# Patient Record
Sex: Female | Born: 1945 | Race: White | Hispanic: No | Smoking: Never smoker
Health system: Southern US, Community
[De-identification: ages and names within clinical notes are randomized; demographics above are authoritative.]

## PROBLEM LIST (undated history)

## (undated) DIAGNOSIS — I4819 Other persistent atrial fibrillation: Secondary | ICD-10-CM

## (undated) DIAGNOSIS — S8991XA Unspecified injury of right lower leg, initial encounter: Secondary | ICD-10-CM

## (undated) DIAGNOSIS — F419 Anxiety disorder, unspecified: Secondary | ICD-10-CM

## (undated) DIAGNOSIS — F329 Major depressive disorder, single episode, unspecified: Secondary | ICD-10-CM

## (undated) DIAGNOSIS — I890 Lymphedema, not elsewhere classified: Secondary | ICD-10-CM

## (undated) DIAGNOSIS — M1990 Unspecified osteoarthritis, unspecified site: Secondary | ICD-10-CM

## (undated) DIAGNOSIS — R5383 Other fatigue: Secondary | ICD-10-CM

## (undated) DIAGNOSIS — I10 Essential (primary) hypertension: Secondary | ICD-10-CM

## (undated) DIAGNOSIS — F32A Depression, unspecified: Secondary | ICD-10-CM

## (undated) HISTORY — DX: Unspecified osteoarthritis, unspecified site: M19.90

## (undated) HISTORY — DX: Essential (primary) hypertension: I10

## (undated) HISTORY — DX: Other persistent atrial fibrillation: I48.19

## (undated) HISTORY — DX: Other fatigue: R53.83

## (undated) HISTORY — DX: Lymphedema, not elsewhere classified: I89.0

## (undated) HISTORY — DX: Major depressive disorder, single episode, unspecified: F32.9

## (undated) HISTORY — DX: Unspecified injury of right lower leg, initial encounter: S89.91XA

## (undated) HISTORY — DX: Depression, unspecified: F32.A

## (undated) HISTORY — DX: Anxiety disorder, unspecified: F41.9

---

## 2006-01-26 ENCOUNTER — Ambulatory Visit: Payer: Self-pay | Admitting: Oncology

## 2009-08-28 ENCOUNTER — Encounter: Payer: Self-pay | Admitting: Cardiovascular Disease

## 2009-09-25 ENCOUNTER — Ambulatory Visit: Payer: Self-pay | Admitting: Cardiovascular Disease

## 2009-10-04 ENCOUNTER — Ambulatory Visit: Payer: Self-pay

## 2009-10-04 ENCOUNTER — Encounter: Payer: Self-pay | Admitting: Cardiovascular Disease

## 2009-10-04 ENCOUNTER — Ambulatory Visit (HOSPITAL_COMMUNITY): Admission: RE | Admit: 2009-10-04 | Discharge: 2009-10-04 | Payer: Self-pay | Admitting: Cardiovascular Disease

## 2009-10-04 ENCOUNTER — Ambulatory Visit: Payer: Self-pay | Admitting: Cardiovascular Disease

## 2009-11-06 ENCOUNTER — Ambulatory Visit: Payer: Self-pay | Admitting: Cardiovascular Disease

## 2010-01-29 ENCOUNTER — Ambulatory Visit: Payer: Self-pay | Admitting: Cardiovascular Disease

## 2010-02-05 ENCOUNTER — Telehealth: Payer: Self-pay | Admitting: Cardiovascular Disease

## 2010-03-05 ENCOUNTER — Ambulatory Visit: Payer: Self-pay | Admitting: Cardiovascular Disease

## 2011-01-23 NOTE — Progress Notes (Signed)
  Phone Note Outgoing Call   Call placed by: Dessie Coma LPN Call placed to: Patient Summary of Call: Patient notified per Dr. Freida Busman, Echo showed normal heart function.

## 2011-02-18 ENCOUNTER — Telehealth: Payer: Self-pay | Admitting: Cardiovascular Disease

## 2011-02-24 ENCOUNTER — Ambulatory Visit (INDEPENDENT_AMBULATORY_CARE_PROVIDER_SITE_OTHER): Payer: Medicare Other | Admitting: Cardiovascular Disease

## 2011-02-24 DIAGNOSIS — I10 Essential (primary) hypertension: Secondary | ICD-10-CM

## 2011-02-24 DIAGNOSIS — I4891 Unspecified atrial fibrillation: Secondary | ICD-10-CM

## 2011-02-27 NOTE — Progress Notes (Signed)
Summary: pt request call  Phone Note Call from Patient Call back at Home Phone [phone]   Caller: Patient Reason for Call: Talk to Nurse Summary of Call: Pt request call Initial call taken by: Judie Grieve,  February 18, 2011 10:19 AM  Follow-up for Phone Call        Phone Call Completed, Appt Scheduled for Monday 02/24/11.  Having no problems-just wants check up. Follow-up by: Dessie Coma  LPN,  February 18, 2011 10:30 AM

## 2011-03-03 ENCOUNTER — Encounter: Payer: Self-pay | Admitting: Cardiovascular Disease

## 2011-03-14 NOTE — Assessment & Plan Note (Signed)
[facility] CARDIOLOGY OFFICE NOTE  CHIRSTY, Holmes                    MRN:          [id number] DATE:02/24/2011                            DOB:          11-22-1946   Erika Holmes is a 65 year old female who is here today for a followup visit.  She has following problem list: 1. Paroxysmal atrial fibrillation. 2. Hypertension. 3. Lymphedema. 4. Traumatic right leg injury, which resulted in severe infection     leading to multiple surgeries.  INTERIM HISTORY:  The patient had a fall last year in March.  She had an infected right leg, likely related to her history of lymphedema.  She was hospitalized for 2-1/2 months at [facility] due to needing multiple surgeries and wound care.  According to the patient, she only had atrial fibrillation once there.  Overall, she has been relatively stable from a cardiac standpoint.  She has been feeling tired lately and has been bradycardic.  Her blood pressure has not been running high at home according to the patient.  She will be having hard time affording Rhythmol sustained release and is asking for an alternative.  She denies any chest pain or dyspnea.  MEDICATIONS: 1. Lisinopril 40 mg once daily. 2. Metoprolol succinate 50 mg twice daily, which will be decreased to     once daily. 3. Ambien 10 mg at bedtime as needed. 4. Rhythmol sustained release 325 mg twice daily. 5. Aspirin 325 mg once daily.  ALLERGIES:  CODEINE.  PHYSICAL EXAMINATION:  GENERAL:  She appears to be older than her stated age, but in no acute distress. VITAL SIGNS:  Weight is 217 pounds, blood pressure is 160/80, pulse is 50, oxygen saturation is 99% on room air. HEENT:  Normocephalic, atraumatic. NECK:  No JVD or carotid bruits. RESPIRATORY:  Normal respiratory effort with no use of accessory muscles.  Auscultation reveals normal breath sounds.  CARDIOVASCULAR: Normal PMI.   Normal S1 and S2 with no gallops.  There is a 1/6 systolic ejection murmur at the base.  ABDOMEN:  Benign, nontender, nondistended. EXTREMITIES:  With bilateral lower extremity lymphedema with extensive scarring in the right leg from surgery.  She also has a special wrapping on both legs. PSYCHIATRIC:  She is alert, oriented x3 with normal mood and affect.  Electrocardiogram was performed, which showed sinus bradycardia with a heart rate of 48 beats per minute, left axis deviation and poor R-wave progression in the anterior leads, which is not different from her previous electrocardiogram.  IMPRESSION: 1. Paroxysmal atrial fibrillation:  She continues to be in normal     sinus rhythm with Rhythmol SR, which will be continued twice daily.     She will be having hard time affording this medication by September     and asked about alternatives.  We will check the pricing on the     propafenone immediate release to see if she will be able to afford     that.  Alternatively, we can consider a different antiarrhythmic     medication such as sotalol.  If we are going to switch her  antiarrhythmic medications, we will request an echocardiogram     during her next followup visit.  I again had a prolonged discussion     with her about indications for anticoagulation.  She had hard time     with warfarin in the past and her INR could not be controlled.  The     patient does not want to go back on warfarin at any cost.  I     discussed with her other alternatives such as Pradaxa and other     agents.  She is not able to afford these medications.  Her current     CHADS VAS score is 3.  I think down the road and the next 1 hour 2     years, it becomes more important that she goes on anticoagulation     when her risk of stroke goes up. 2. Hypertension:  Blood pressure is not well controlled.  According to     the patient, she is anxious.  We will consider adding amlodipine     upon followup.  The  patient will follow up in 4 months from now or     earlier if needed.    [name], MD Electronically Signed   MA/MedQ  DD: 02/24/2011  DT: 02/25/2011  Job #: [id number]

## 2011-05-06 NOTE — Assessment & Plan Note (Signed)
[facility] CARDIOLOGY OFFICE NOTE   JAYELYN, BARNO                    MRN:          [id number]  DATE:09/25/2009                            DOB:          03-Dec-1946    CHIEF COMPLAINT:  Establishing cardiovascular care.   HISTORY OF PRESENT ILLNESS:  The patient is a 65 year old white female  with past medical history significant for remote atrial fibrillation,  hypertension, hyperlipidemia who is presenting to reestablish  cardiovascular care.  The patient was previously been seen by [facility].  She states her main cardiovascular problem has been atrial  fibrillation.  However, the problem started several years ago when she  had 1 episode of symptomatic atrial fibrillation.  She states she was  admitted to the hospital for several days.  Several years later, the  patient had an additional episode of atrial fibrillation that was again  symptomatic.  The patient had initially been placed on Coumadin.  However, she had some issues with expanding hematoma in her lower  extremity that lasts quite sometime and the Coumadin was stopped for  this reason.  She was subsequently placed on Rythmol for which she has  been on for some time for rhythm control.  The patient denies any recent  chest discomfort, palpitations or dizziness.  She does state she has had  an increase in her lower extremity swelling.  Although, she states she  has always had larger legs.  She believes that there is now some  additional fluid.  The patient states she also has a family history of  lymphedema and is concerned this may be part of the problem.   PAST MEDICAL HISTORY:  As above in HPI.   FAMILY HISTORY:  Positive for coronary artery disease.   SOCIAL HISTORY:  Negative for tobacco or alcohol use.   ALLERGIES:  CODEINE.   MEDICATIONS:  1. Aspirin 81 mg daily.  2. Metoprolol succinate ER 50 mg b.i.d.  3. Lisinopril 40 mg  daily.  4. Zolpidem 10 mg nightly.  5. Rythmol SR 225 mg every 12 hours.   REVIEW OF SYSTEMS:  As above in HPI.  In addition, the patient has a  history of some depression, osteoarthritis, fatigue, asthma-like  symptoms and anxiety.   LABORATORY DATA:  Review of the patient's record shows labs from August  2010 with a sodium of 140, potassium of 4.3, chloride 109, CO2 23, BUN  30, creatinine 1.0.  CBC showed a white count of 8, hemoglobin of 13.5,  hematocrit 40.3, platelet count 210.  Her LDL was 137, her HDL of 59,  total cholesterol was 211, and triglycerides was 74.  Review of the  patient's echocardiogram report dated July 2006.  At that time, there is  mild concentric left ventricular hypertrophy, the ejection fraction was  approximately 60-65%.  At that time, the patient was in atrial  fibrillation.  There is also mild MR and mild TR.  EKG from today  independently interpreted by myself demonstrates sinus bradycardia with  some minimal baseline artifact.  The ventricular rate is 59 beats per  minute.  There is a nonspecific T-wave abnormalities.   ASSESSMENT:  A 65 year old white female with atrial fibrillation that  appears to be rhythm controlled with Rythmol.  She also has increasing  lower extremity edema.  Lower extremity edema may be secondary to a  lymphedema with a component of diastolic dysfunction as well.  Her  increased BUN creatinine ratio suggests that her cardiac output may be  decreased.   PLAN:  Today, in clinic, we would like to order an echocardiogram to  evaluate her left ventricular function for signs of any diastolic  dysfunction.  We would also like to place the patient on a 48-hour  Holter monitor test to evaluate for atrial fibrillation or any other  arrhythmia that may be occurring asymptomatically.  I will start her on  a Lasix 20 mg daily.  It may be worthwhile in the future for the patient  to see a wound care center or podiatrist for potential  treatments of  lymphedema.  We will check a BNP in 1 weeks' time after she has been on  Lasix.  We will see her back in clinic in 2 weeks.     Brayton El, MD  Electronically Signed    SGA/MedQ  DD: 09/26/2009  DT: 09/27/2009  Job #: [id number]

## 2011-05-06 NOTE — Assessment & Plan Note (Signed)
[facility] CARDIOLOGY OFFICE NOTE   Erika Holmes, Erika Holmes                    MRN:          [id number]  DATE:01/29/2010                            DOB:          12/11/1946    PROBLEM LIST:  1. Remote history of atrial fibrillation.  She recently wore 48-hour      Holter monitor which showed sinus rhythm with premature atrial      contraction.  She had heart catheterization in 2002 which showed no      coronary artery disease.  2. Hypertension.  3. Lymphedema.   INTERVAL HISTORY:  Since the patient's last office visit, she was  admitted to the hospital with AFib with RPR.  She was rate controlled  and sent home.  The patient states that the symptoms she experienced  prior to that hospital admission was very similar to what she  experienced 7 years ago when she had atrial fibrillation.  Since she has  been home, she has not had any recurrence of these symptoms, although  she continues to get occasional palpitations that have been demonstrated  to be PACs on a prior Holter monitor.  She is compliant with her  medications and has had her blood pressure checked at a primary care  doctor's office and she believes that it was in the 130 systolic.   PHYSICAL EXAMINATION:  VITAL SIGNS:  Today, her blood pressure is 181/82  in the right arm and 183/93 in the left arm, pulse is 70, she weighs 269  pounds.  She is sating 97% on room air.  GENERAL:  No acute distress.  HEENT:  Normocephalic, atraumatic.  NECK:  Supple.  HEART:  Regular rate and rhythm without murmur, rub or gallop.  LUNGS:  Clear bilaterally.  ABDOMEN:  Soft, nontender, nondistended.  EXTREMITIES:  Bilateral lower extremity lymphedema with some ecchymoses  and bruising over her lower extremities.  SKIN:  Some bruising on her hands from IV use while in the hospital.   ASSESSMENT AND PLAN:  1. Atrial fibrillation.  The patient has been on Rythmol for  approximately 7 years without any demonstrated episodes of atrial      fibrillation until recently.  Today, we will increase her Rythmol      SR from 225 mg twice a day to 325 mg twice a day.  She should      continue on the Toprol-XL 50 mg b.i.d.  The patient has      hypertension and diastolic dysfunction giving her CHADS 2 score of      at least 1 and possibly 2 counting the diastolic dysfunction and a      CHADS-VASC score of 2-3.  In the past, she has had issues with      Coumadin in that her INR was very difficult to regulate per her.      The patient will contact her insurance company to see how much      Pradaxa therapy would cost and will contact our office.  It was      likely that we will institute this therapy if she  can afford it.  2. Hypertension.  The patient is on hydrochlorothiazide 25 mg daily,      lisinopril 40 mg daily, and Toprol-XL 50 mg twice a day.  Today,      her blood pressure is elevated.  However, she states that it is      usually with normal limits.  My review of the hospital record shows      that her systolic blood pressures ranged from 120-160.  We will ask      that she check her blood pressure twice a day at home and then      phone this log in to [place].  3. Diastolic dysfunction.  The patient had a recent echo at [place]      that we will have sent to our office to assure that there has been      no change, but once we have an idea of what her blood pressure      readings are at home, I will make a decision regarding advancing      antihypertensive therapy and a possible evaluation for secondary      causes of hypertension as patient does have some shortness of      breath that may be at least in part secondary to diastolic      dysfunction.  Better control of her blood pressure may help address      this.      Brayton El, MD  Electronically Signed    SGA/MedQ  DD: 01/29/2010  DT: 01/30/2010  Job #: [id number]

## 2011-05-06 NOTE — Assessment & Plan Note (Signed)
[facility]                        [place] CARDIOLOGY OFFICE NOTE   Erika Holmes, Erika Holmes                    MRN:          [id number]  DATE:11/06/2009                            DOB:          1946-04-27    PROBLEM LIST:  1. Remote history of atrial fibrillation.  The patient has recently      wore a 48-hour Holter monitor that showed no recurrence of atrial      fibrillation on Rythmol.  She also had a heart catheterization that      showed no coronary artery disease in 2002.  2. Hypertension.  3. Lymphedema.   INTERVAL HISTORY:  The patient states since her last visit she has had  some increasing pain in her right knee.  She plans to see an orthopedic  surgeon regarding this.  She states that she had 1 episode of  palpitations lasting approximately 20 minutes that resolved after taking  an extra half of Toprol.  She has had no other episodes of palpitations.  She also denies any chest pain.  She has a chronic lymphedema and  occasionally has swelling in her feet as well that resolves after  elevation of the foot.  She recently saw a surgeon regarding this and he  has ordered some type of compression treatments for her at home.   PHYSICAL EXAMINATION:  VITAL SIGNS:  Blood pressure 163/85, pulse 78,  weight 266, sating 98% on room air.  GENERAL:  No acute distress.  HEENT:  Nonfocal.  HEART:  Regular rate and rhythm, but distant heart sounds.  LUNGS:  Clear bilaterally.  ABDOMEN:  Soft, nontender.  EXTREMITIES:  Bilateral lower extremity lymphedema with compression hose  in place.   LABORATORY DATA:  Review of the patient's echocardiogram from the last  visit shows an ejection fraction of 55-60% with pseudonormalization of  left ventricle indicating diastolic dysfunction.  Review of the  patient's 48-hour Holter monitor demonstrates, normal sinus rhythm with  premature atrial contractions.   ASSESSMENT AND PLAN:  1. Atrial fibrillation.   The patient is to continue on Rythmol.  It      appears that her rhythm control has been successful with this      medication.  If she were to experience any palpitations during the      day that are continuous, she should report to our office.  If they      occur at night and they do not resolve after taking an extra      Toprol, she should report to the emergency room.  She is on      aspirin.  If she were to prove to have atrial fibrillation in the      future, we would reconsider anticoagulation with either Coumadin or      perhaps dabigatran.  Of note, the patient states she had a lot      problems with Coumadin in the past and that her INR was very      difficult to be regulated.  2. Hypertension.  The patient's blood pressure remains elevated.  She  states that it is always over 140s systolic.  Today, we will add      hydrochlorothiazide 25 mg daily to her medical regimen.  She can      take this instead of the furosemide 20 mg daily, which she is      currently taking.  She states furosemide has not really done      anything to improve her symptoms or edema.  3. Diastolic dysfunction.  The patient does have some dyspnea on      exertion and an echo that is consistent with a diastolic      dysfunction.  We will optimize her blood pressure control in order      to minimize the clinical fact that this diastolic dysfunction may      have on her.  She is followed monthly by Dr. Nedra Hai and we will see      her again in a 4 months' time.     Brayton El, MD  Electronically Signed    SGA/MedQ  DD: 11/06/2009  DT: 11/07/2009  Job #: [id number]

## 2011-05-06 NOTE — Assessment & Plan Note (Signed)
[facility] CARDIOLOGY OFFICE NOTE   Erika Holmes, Erika Holmes                    MRN:          [id number]  DATE:03/05/2010                            DOB:          [birth year]    PROBLEM LIST:  1. History of atrial fibrillation.  She wore a recent 48-hour Holter      monitor that showed sinus rhythm with premature atrial      contractions, but she was admitted to the hospital in February of      this year with atrial fibrillation with rapid ventricular response.  2. Hypertension.  3. Lymphedema.   The patient states she has been doing well since her last visit.  She  continues to get occasional palpitations in the evenings that have been  demonstrated in the past, not to be atrial fibrillation.  She states she  has not had any recurrence of symptomatic atrial fibrillation.  She also  denies any chest discomfort or significant change in dyspnea.  She  states she did fall which caused an injury to the lateral aspect of her  right leg that has been oozing blood since then, but she denies any  warmth or pus drainage.   PHYSICAL EXAMINATION:  VITAL SIGNS:  Today, her blood pressure is  144/85, pulse is 83, saturating 96% on room air.  She weighs 269 pounds  which is what she weighed 1 month ago.  GENERAL:  She is in no acute distress.  HEENT:  Normocephalic, atraumatic.  HEART:  Regular rate and rhythm without murmur, rub, or gallop.  LUNGS:  Clear bilaterally.  EXTREMITIES:  Bilateral lower extremity lymphedema.  There is moderate-  to-severe ecchymoses over her right leg and there is a bandaged area on  the lateral aspect of the leg.   Review of labs, a lipid profile dated, February 6, shows a LDL of 89,  triglyceride level of 53, and a cholesterol HDL ratio of 2.9.   ASSESSMENT AND PLAN:  1. Atrial fibrillation.  She should continue on Rythmol SR 325 mg      b.i.d.  She should also continue on aspirin 325 mg daily.   Once the      issues with the oozing blood on the right leg resolved, I would      like to have another discussion with her about retrying Coumadin.      In the past, she had been on Coumadin, but she states there were      great difficulties in regulating her INR, so it was stopped.  She      currently cannot afford Pradaxa therapy.  Her CHADS-VAS score is      between 2 and 3.  2. Hypertension.  Blood pressure is under reasonable control today.      She should continue on her current medical regimen as listed in the      chart.  3. Lower extremity wound.  She should continue bandage dressings.  She      will follow up with      her primary care physician regarding further management of the  wound.  4. Lipids.  Her last LDL was within normal limits.  We will see the      patient back in 4 months' time unless problem arises in the      interim.     Brayton El, MD  Electronically Signed    SGA/MedQ  DD: 03/05/2010  DT: 03/06/2010  Job #: [id number]

## 2011-05-06 NOTE — Letter (Signed)
September 26, 2009    [name], MD  [address]  [place], [place] [address]   RE:  [name], [name]  MRN:  [id number]  /  DOB:  [birth year]   Dear Dr. Nedra Hai:   I am writing to you regarding your patient, Erika Holmes, whom I saw  in Cardiology Clinic.  As you know, she is a 65 year old white female  with a history of atrial fibrillation, hypertension, hyperlipidemia and  lower extremity edema.  She comes to the Cardiology Clinic complaining  of increasing lower extremity edema and questions about the management  for her atrial fibrillation.  It does not appear that she is having any  symptomatic atrial fibrillation.  However, I have ordered a 48-hour  Holter monitor for further evaluation.  I have also ordered a  transthoracic echocardiogram to assure that her ejection fraction  remains normal and to look for any signs of diastolic dysfunction.  It  is likely that the lower extremity edema has some component of diastolic  dysfunction and perhaps lymphedema.   I look forward to following this patient along with you.  Please feel  free to contact my office in the future with any questions or concerns.    Sincerely,      Brayton El, MD  Electronically Signed    SGA/MedQ  DD: 09/26/2009  DT: 09/26/2009  Job #: [id number]

## 2011-07-07 ENCOUNTER — Ambulatory Visit: Payer: Medicare Other | Admitting: Cardiovascular Disease

## 2011-07-14 ENCOUNTER — Encounter: Payer: Self-pay | Admitting: Cardiovascular Disease

## 2011-07-14 ENCOUNTER — Ambulatory Visit (INDEPENDENT_AMBULATORY_CARE_PROVIDER_SITE_OTHER): Payer: Medicare Other | Admitting: Cardiovascular Disease

## 2011-07-14 VITALS — BP 113/74 | HR 71 | Ht 62.0 in | Wt 203.0 lb

## 2011-07-14 DIAGNOSIS — I4819 Other persistent atrial fibrillation: Secondary | ICD-10-CM

## 2011-07-14 DIAGNOSIS — I10 Essential (primary) hypertension: Secondary | ICD-10-CM | POA: Insufficient documentation

## 2011-07-14 DIAGNOSIS — I4891 Unspecified atrial fibrillation: Secondary | ICD-10-CM

## 2011-07-14 NOTE — Progress Notes (Signed)
HPI  This is a 65 year old female who is here today for a followup visit. She has a history of atrial fibrillation in pain and in sinus rhythm with Rythmol. She has severe lymphedema of lower extremities with chronic wounds. She had an injury to her right leg last year which took months to heal. She had a recent injury to her left leg and seems to be having difficulty with wound healing as well. She seems to be stable from a cardiac standpoint. She denies any chest pain or dyspnea. Her palpitations are rare and brief.  Allergies  Allergen Reactions  . Codeine      Current Outpatient Prescriptions on File Prior to Visit  Medication Sig Dispense Refill  . ALPRAZolam (XANAX) 0.25 MG tablet Take 0.25 mg by mouth 3 (three) times daily as needed.        Marland Kitchen lisinopril (PRINIVIL,ZESTRIL) 40 MG tablet Take 40 mg by mouth daily.        . metoprolol (TOPROL-XL) 50 MG 24 hr tablet Take 50 mg by mouth 2 (two) times daily.        . OXYCODONE-ACETAMINOPHEN PO Take 5-325 mg by mouth every 8 (eight) hours as needed. Take 1/2 tablet every 8 hours prn       . propafenone (RYTHMOL SR) 325 MG 12 hr capsule Take 325 mg by mouth 2 (two) times daily.        Marland Kitchen zolpidem (AMBIEN) 10 MG tablet Take 10 mg by mouth at bedtime as needed.           Past Medical History  Diagnosis Date  . Lymphedema   . Right leg injury   . Asthma   . Fatigue   . Arthritis   . Anxiety and depression   . Hypertension   . Persistent atrial fibrillation      No past surgical history on file.   No family history on file.   History   Social History  . Marital Status: Widowed    Spouse Name: N/A    Number of Children: N/A  . Years of Education: N/A   Occupational History  . Not on file.   Social History Main Topics  . Smoking status: Never Smoker   . Smokeless tobacco: Not on file  . Alcohol Use: No  . Drug Use: No  . Sexually Active: Not on file   Other Topics Concern  . Not on file   Social History Narrative    . No narrative on file       PHYSICAL EXAM   BP 113/74  Pulse 71  Ht 5\' 2"  (1.575 m)  Wt 203 lb (92.08 kg)  BMI 37.13 kg/m2  SpO2 98%  Constitutional: She is oriented to person, place, and time. She appears well-developed and well-nourished. No distress.  HENT: No nasal discharge.  Head: Normocephalic and atraumatic.  Eyes: Pupils are equal, round, and reactive to light. Right eye exhibits no discharge. Left eye exhibits no discharge.  Neck: Normal range of motion. Neck supple. No JVD present. No thyromegaly present.  Cardiovascular: Normal rate, regular rhythm, normal heart sounds and intact distal pulses. Exam reveals no gallop and no friction rub.  No murmur heard.  Pulmonary/Chest: Effort normal and breath sounds normal. No stridor. No respiratory distress. She has no wheezes. She has no rales. She exhibits no tenderness.  Abdominal: Soft. Bowel sounds are normal. She exhibits no distension. There is no tenderness. There is no rebound and no guarding.  Musculoskeletal: Normal range  of motion. She exhibits severe nonpitting edema and no tenderness. The left leg is wrapped. Neurological: She is alert and oriented to person, place, and time. Coordination normal.  Skin: Skin is warm and dry. No rash noted. She is not diaphoretic. No erythema. No pallor. Significant scar tissue in the right leg from previous surgeries.  Psychiatric: She has a normal mood and affect. Her behavior is normal. Judgment and thought content normal.     EKG: Normal sinus rhythm with no significant ST or T wave changes. Normal PR and QT intervals   ASSESSMENT AND PLAN

## 2011-07-14 NOTE — Assessment & Plan Note (Signed)
The patient remains mostly normal sinus rhythm with Rythmol. However, starting in September she will enter the donut hole and will not be able to afford the medication. She checked on generic propafenone and that seems to be the same price. Due to that, I will give her Multaq 400 mg twice daily with food during that time. She was given samples. If the medication works for, a prescription will be sent. She will let us know at that time. I gave her co-pay cord also to see if that can work with her insurance. Continue aspirin daily. Her CHADS score is 1.

## 2011-08-07 ENCOUNTER — Encounter: Payer: Self-pay | Admitting: Cardiovascular Disease

## 2011-10-08 ENCOUNTER — Other Ambulatory Visit: Payer: Self-pay | Admitting: Cardiovascular Disease

## 2011-10-08 NOTE — Telephone Encounter (Signed)
Pt calling wanting to know if there is a cheaper version of this RX--rythmol. Please return pt call to discuss further.  Pt RX says phone number for pharmacy is [phone].

## 2011-10-09 MED ORDER — PROPAFENONE HCL ER 325 MG PO CP12: 325.0000 mg | ORAL_CAPSULE | Freq: Two times a day (BID) | ORAL | Status: DC

## 2012-01-19 ENCOUNTER — Ambulatory Visit: Payer: Medicare Other | Admitting: Internal Medicine

## 2012-02-02 ENCOUNTER — Ambulatory Visit: Payer: Medicare Other | Admitting: Internal Medicine

## 2012-02-20 ENCOUNTER — Ambulatory Visit (INDEPENDENT_AMBULATORY_CARE_PROVIDER_SITE_OTHER): Payer: Medicare Other | Admitting: Internal Medicine

## 2012-02-20 ENCOUNTER — Encounter: Payer: Self-pay | Admitting: Internal Medicine

## 2012-02-20 VITALS — BP 130/82 | HR 65 | Resp 18 | Ht 64.0 in | Wt 245.4 lb

## 2012-02-20 DIAGNOSIS — I4819 Other persistent atrial fibrillation: Secondary | ICD-10-CM

## 2012-02-20 DIAGNOSIS — I10 Essential (primary) hypertension: Secondary | ICD-10-CM

## 2012-02-20 DIAGNOSIS — I4891 Unspecified atrial fibrillation: Secondary | ICD-10-CM

## 2012-02-20 MED ORDER — PROPAFENONE HCL 225 MG PO TABS: 225.0000 mg | ORAL_TABLET | Freq: Three times a day (TID) | ORAL | Status: DC

## 2012-02-20 NOTE — Assessment & Plan Note (Signed)
Stable No change required today  

## 2012-02-20 NOTE — Progress Notes (Signed)
[name], MD, MD: Primary Cardiologist:  Previously Dr Kirke Corin in [facility] Erika Holmes is a 66 y.o. female with a h/o atrial fibrillation who presents today to establish care in the Electrophysiology device clinic.    The patient reports doing very well since taking rhythmol SR.  Her concern is that she cannot afford this medication.  She denies any recent episodes of afib but reports brief palpitations at night.   Today, she  denies symptoms of chest pain, shortness of breath, orthopnea, PND, dizziness, presyncope, syncope, or neurologic sequela.  She has chronic lymphedema which has been a struggle.  She previously took coumadin but stopped this medicine due to labile INRs.  The patientis tolerating medications without difficulties and is otherwise without complaint today.   Past Medical History  Diagnosis Date  . Lymphedema   . Right leg injury   . Asthma   . Fatigue   . Arthritis   . Anxiety and depression   . Hypertension   . Persistent atrial fibrillation    No past surgical history on file.  History   Social History  . Marital Status: Widowed    Spouse Name: N/A    Number of Children: N/A  . Years of Education: N/A   Occupational History  . Not on file.   Social History Main Topics  . Smoking status: Never Smoker   . Smokeless tobacco: Not on file  . Alcohol Use: No  . Drug Use: No  . Sexually Active: Not on file   Other Topics Concern  . Not on file   Social History Narrative   Lives in [place]    No family history on file.  Allergies  Allergen Reactions  . Codeine     Current Outpatient Prescriptions  Medication Sig Dispense Refill  . ALPRAZolam (XANAX) 0.25 MG tablet Take 0.25 mg by mouth 3 (three) times daily as needed.        Marland Kitchen aspirin 81 MG tablet Take 81 mg by mouth daily.        Marland Kitchen lisinopril (PRINIVIL,ZESTRIL) 40 MG tablet Take 40 mg by mouth daily.        . metoprolol (TOPROL-XL) 50 MG 24 hr tablet Take 50 mg by mouth 2 (two) times daily.         Marland Kitchen omeprazole (PRILOSEC) 20 MG capsule Take 20 mg by mouth daily.      . OXYCODONE-ACETAMINOPHEN PO Take 5-325 mg by mouth every 8 (eight) hours as needed. Take 1/2 tablet every 8 hours prn       . promethazine (PHENERGAN) 25 MG tablet Take 25 mg by mouth every 6 (six) hours as needed.        . zolpidem (AMBIEN) 10 MG tablet Take 10 mg by mouth at bedtime as needed.          ROS- all systems are reviewed and negative except as per HPI  Physical Exam: Filed Vitals:   02/20/12 1016  BP: 130/82  Pulse: 65  Resp: 18  Height: 5\' 4"  (1.626 m)  Weight: 245 lb 6.4 oz (111.313 kg)    GEN- The patient is overweight appearing, alert and oriented x 3 today.   Head- normocephalic, atraumatic Eyes-  Sclera clear, conjunctiva pink Ears- hearing intact Oropharynx- clear Neck- supple, no JVP Lymph- no cervical lymphadenopathy Lungs- Clear to ausculation bilaterally, normal work of breathing Heart- Regular rate and rhythm, no murmurs, rubs or gallops, PMI not laterally displaced GI- soft, NT, ND, + BS Extremities- no  clubbing, cyanosis, + chronic lymphedema both legs MS- age appropriate atrophy Skin- multiple prior surgical scars on legs Psych- euthymic mood, full affect Neuro- strength and sensation are intact  ekg today reveals sinus 65 bpm, PR 166, QRS 92, poor R wave progression  Assessment and Plan:

## 2012-02-20 NOTE — Patient Instructions (Signed)
Your physician wants you to follow-up in: 6 months with Dr. Johney Frame. You will receive a reminder letter in the mail two months in advance. If you don't receive a letter, please call our office to schedule the follow-up appointment.  Your physician has requested that you have an echocardiogram. Echocardiography is a painless test that uses sound waves to create images of your heart. It provides your doctor with information about the size and shape of your heart and how well your heart's chambers and valves are working. This procedure takes approximately one hour. There are no restrictions for this procedure.  Change to Rythmol 225mg  three times per day.

## 2012-02-20 NOTE — Assessment & Plan Note (Signed)
Doing well Will switch to generic rhythmol 225mg  TID  CHADS2 score is 1.  She is clear that she will not take coumadin or a novel anticoagulant.  She will only take ASA.  No changes today Echo Return in 6 months

## 2012-03-03 ENCOUNTER — Other Ambulatory Visit (HOSPITAL_COMMUNITY): Payer: Medicare Other

## 2012-03-05 ENCOUNTER — Other Ambulatory Visit (HOSPITAL_COMMUNITY): Payer: Medicare Other

## 2012-04-29 ENCOUNTER — Other Ambulatory Visit (HOSPITAL_COMMUNITY): Payer: Medicare Other

## 2012-05-14 ENCOUNTER — Ambulatory Visit (HOSPITAL_COMMUNITY): Payer: Medicare Other

## 2012-06-28 ENCOUNTER — Telehealth: Payer: Self-pay | Admitting: Internal Medicine

## 2012-06-28 NOTE — Telephone Encounter (Signed)
Pt picked up rx for propafenone.  She cannot afford the $283 for the rx.  Her insurance told her they would cover it for her co-pay if it is not time-released.  She is asking for a cheaper med.

## 2012-06-28 NOTE — Telephone Encounter (Signed)
New msg Pt said propafenone would be cheaper is it is not time released. Please call

## 2012-06-28 NOTE — Telephone Encounter (Signed)
Rx changed to Propafenone 225mg  tid not SR per Dr Johney Frame.  [facility] pharmacy was notified.

## 2012-06-28 NOTE — Telephone Encounter (Signed)
Pt was notified that the regular Propafenone was ordered.

## 2012-07-27 ENCOUNTER — Other Ambulatory Visit: Payer: Self-pay | Admitting: Internal Medicine

## 2012-07-27 MED ORDER — PROPAFENONE HCL 225 MG PO TABS: 225.0000 mg | ORAL_TABLET | Freq: Three times a day (TID) | ORAL | Status: DC

## 2012-07-28 ENCOUNTER — Telehealth: Payer: Self-pay | Admitting: Internal Medicine

## 2012-08-20 ENCOUNTER — Ambulatory Visit (INDEPENDENT_AMBULATORY_CARE_PROVIDER_SITE_OTHER): Payer: Medicare Other | Admitting: Internal Medicine

## 2012-08-20 ENCOUNTER — Encounter: Payer: Self-pay | Admitting: Internal Medicine

## 2012-08-20 VITALS — BP 128/72 | HR 63 | Wt 247.8 lb

## 2012-08-20 DIAGNOSIS — I4891 Unspecified atrial fibrillation: Secondary | ICD-10-CM

## 2012-08-20 DIAGNOSIS — I10 Essential (primary) hypertension: Secondary | ICD-10-CM

## 2012-08-20 DIAGNOSIS — I4819 Other persistent atrial fibrillation: Secondary | ICD-10-CM

## 2012-08-20 MED ORDER — METOPROLOL TARTRATE 25 MG PO TABS: 100.0000 mg | ORAL_TABLET | Freq: Two times a day (BID) | ORAL | Status: DC

## 2012-08-20 NOTE — Assessment & Plan Note (Signed)
Stable No change required today  

## 2012-08-20 NOTE — Assessment & Plan Note (Signed)
Maintaining sinus rhythm with rhythmol She continues to be clear that she will not take anticoagulants

## 2012-08-20 NOTE — Patient Instructions (Signed)
Your physician wants you to follow-up in: 9 months with Dr Allred You will receive a reminder letter in the mail two months in advance. If you don't receive a letter, please call our office to schedule the follow-up appointment.  

## 2012-08-20 NOTE — Progress Notes (Signed)
[name], MD: Primary Cardiologist:  Previously Dr Kirke Corin in [facility] Erika Holmes is a 66 y.o. female with a h/o atrial fibrillation who presents today for follow-up.   Her afib is presently controlled.  Her primary concern is with R knee pain.  Today, she  denies symptoms of chest pain, shortness of breath, orthopnea, PND, dizziness, presyncope, syncope, or neurologic sequela.  She has chronic lymphedema which continues to be a struggle.  She previously took coumadin but stopped this medicine due to labile INRs.  The patientis tolerating medications without difficulties and is otherwise without complaint today.   Past Medical History  Diagnosis Date  . Lymphedema   . Right leg injury   . Asthma   . Fatigue   . Arthritis   . Anxiety and depression   . Hypertension   . Persistent atrial fibrillation    No past surgical history on file.  History   Social History  . Marital Status: Widowed    Spouse Name: N/A    Number of Children: N/A  . Years of Education: N/A   Occupational History  . Not on file.   Social History Main Topics  . Smoking status: Never Smoker   . Smokeless tobacco: Not on file  . Alcohol Use: No  . Drug Use: No  . Sexually Active: Not on file   Other Topics Concern  . Not on file   Social History Narrative   Lives in [place]    No family history on file.  Allergies  Allergen Reactions  . Codeine     Current Outpatient Prescriptions  Medication Sig Dispense Refill  . ALPRAZolam (XANAX) 0.25 MG tablet Take 0.25 mg by mouth 3 (three) times daily as needed.        Marland Kitchen aspirin 81 MG tablet Take 81 mg by mouth daily.        Marland Kitchen gabapentin (NEURONTIN) 100 MG capsule Take 100 mg by mouth 3 (three) times daily.      Marland Kitchen lisinopril (PRINIVIL,ZESTRIL) 40 MG tablet Take 40 mg by mouth daily.        . metoprolol (LOPRESSOR) 25 MG tablet Take 4 tablets (100 mg total) by mouth 2 (two) times daily.  60 tablet  11  . omeprazole (PRILOSEC) 20 MG capsule Take 20  mg by mouth daily.      . OXYCODONE-ACETAMINOPHEN PO Take 5-325 mg by mouth every 8 (eight) hours as needed. Take 1/2 tablet every 8 hours prn       . promethazine (PHENERGAN) 25 MG tablet Take 25 mg by mouth every 6 (six) hours as needed.        . propafenone (RYTHMOL) 225 MG tablet Take 1 tablet (225 mg total) by mouth every 8 (eight) hours.  90 tablet  1  . zolpidem (AMBIEN) 10 MG tablet Take 10 mg by mouth at bedtime as needed.          Physical Exam: Filed Vitals:   08/20/12 1612  BP: 128/72  Pulse: 63  Weight: 247 lb 12 oz (112.379 kg)    GEN- The patient is overweight appearing, alert and oriented x 3 today.   Head- normocephalic, atraumatic Eyes-  Sclera clear, conjunctiva pink Ears- hearing intact Oropharynx- clear Neck- supple, no JVP Lymph- no cervical lymphadenopathy Lungs- Clear to ausculation bilaterally, normal work of breathing Heart- Regular rate and rhythm, no murmurs, rubs or gallops, PMI not laterally displaced GI- soft, NT, ND, + BS Extremities- no clubbing, cyanosis, + chronic lymphedema  both legs MS- age appropriate atrophy   ekg today reveals sinus 63 bpm, PR 196, QRS 96, inferior infarction  Assessment and Plan:

## 2012-09-23 ENCOUNTER — Other Ambulatory Visit: Payer: Self-pay | Admitting: *Deleted

## 2012-09-23 ENCOUNTER — Other Ambulatory Visit: Payer: Self-pay | Admitting: Internal Medicine

## 2012-09-23 MED ORDER — PROPAFENONE HCL 225 MG PO TABS: 225.0000 mg | ORAL_TABLET | Freq: Three times a day (TID) | ORAL | Status: DC

## 2012-09-23 NOTE — Telephone Encounter (Signed)
propafenone 225mg  tid is back up to $300 at [facility] and she is so upset because she can't afford it

## 2012-09-23 NOTE — Telephone Encounter (Signed)
Patient's medications are all correct now  She is not on extended release Propafenone.  It will only cost her $4 at [facility] drug Patient aware

## 2012-12-24 ENCOUNTER — Other Ambulatory Visit: Payer: Self-pay

## 2012-12-24 MED ORDER — PROPAFENONE HCL 225 MG PO TABS: 225.0000 mg | ORAL_TABLET | Freq: Three times a day (TID) | ORAL | Status: DC

## 2013-02-22 ENCOUNTER — Other Ambulatory Visit: Payer: Self-pay | Admitting: Emergency Medicine

## 2013-02-22 MED ORDER — PROPAFENONE HCL 225 MG PO TABS: 225.0000 mg | ORAL_TABLET | Freq: Three times a day (TID) | ORAL | Status: DC

## 2013-05-02 ENCOUNTER — Ambulatory Visit: Payer: Medicare Other | Admitting: Internal Medicine

## 2013-05-25 ENCOUNTER — Ambulatory Visit (INDEPENDENT_AMBULATORY_CARE_PROVIDER_SITE_OTHER): Payer: Medicare Other | Admitting: Internal Medicine

## 2013-05-25 DIAGNOSIS — I4819 Other persistent atrial fibrillation: Secondary | ICD-10-CM

## 2013-05-25 DIAGNOSIS — I10 Essential (primary) hypertension: Secondary | ICD-10-CM

## 2013-05-25 DIAGNOSIS — I4891 Unspecified atrial fibrillation: Secondary | ICD-10-CM

## 2013-05-25 MED ORDER — PROPAFENONE HCL 225 MG PO TABS: 225.0000 mg | ORAL_TABLET | Freq: Three times a day (TID) | ORAL | Status: DC

## 2013-05-25 MED ORDER — METOPROLOL TARTRATE 12.5 MG HALF TABLET: ORAL_TABLET | ORAL | Status: DC

## 2013-05-25 MED ORDER — LISINOPRIL 40 MG PO TABS: 40.0000 mg | ORAL_TABLET | Freq: Every day | ORAL | Status: DC

## 2013-05-25 MED ORDER — METOPROLOL TARTRATE 25 MG PO TABS: ORAL_TABLET | ORAL | Status: DC

## 2013-05-25 NOTE — Progress Notes (Signed)
Erika Curia, MD: Primary Cardiologist:  Previously Dr Kirke Corin in [facility] Erika Holmes is a 67 y.o. female with a h/o atrial fibrillation who presents today for follow-up.   Her afib is presently controlled.  Her primary concern is with R knee pain.  Today, she  denies symptoms of chest pain, shortness of breath, orthopnea, PND, dizziness, presyncope, syncope, or neurologic sequela.  She has chronic lymphedema which continues to be a struggle.  She previously took coumadin but stopped this medicine due to labile INRs.  The patientis tolerating medications without difficulties and is otherwise without complaint today.   Past Medical History  Diagnosis Date  . Lymphedema   . Right leg injury   . Asthma   . Fatigue   . Arthritis   . Anxiety and depression   . Hypertension   . Persistent atrial fibrillation    No past surgical history on file.  History   Social History  . Marital Status: Widowed    Spouse Name: N/A    Number of Children: N/A  . Years of Education: N/A   Occupational History  . Not on file.   Social History Main Topics  . Smoking status: Never Smoker   . Smokeless tobacco: Not on file  . Alcohol Use: No  . Drug Use: No  . Sexually Active: Not on file   Other Topics Concern  . Not on file   Social History Narrative   Lives in [place]    No family history on file.  Allergies  Allergen Reactions  . Codeine     Current Outpatient Prescriptions  Medication Sig Dispense Refill  . ALPRAZolam (XANAX) 0.25 MG tablet Take 0.25 mg by mouth 3 (three) times daily as needed.        Marland Kitchen aspirin 81 MG tablet Take 81 mg by mouth daily.        Marland Kitchen gabapentin (NEURONTIN) 100 MG capsule Take 100 mg by mouth 3 (three) times daily.      Marland Kitchen lisinopril (PRINIVIL,ZESTRIL) 40 MG tablet Take 1 tablet (40 mg total) by mouth daily.  90 tablet  3  . metoprolol tartrate (LOPRESSOR) 25 MG tablet 1/2 tablet twice daily  90 tablet  3  . omeprazole (PRILOSEC) 20 MG capsule Take 20 mg  by mouth daily.      . OXYCODONE-ACETAMINOPHEN PO Take 5-325 mg by mouth every 8 (eight) hours as needed. Take 1/2 tablet every 8 hours prn       . promethazine (PHENERGAN) 25 MG tablet Take 25 mg by mouth every 6 (six) hours as needed.        . propafenone (RYTHMOL) 225 MG tablet Take 1 tablet (225 mg total) by mouth every 8 (eight) hours.  270 tablet  3  . zolpidem (AMBIEN) 10 MG tablet Take 10 mg by mouth at bedtime as needed.         No current facility-administered medications for this visit.    Physical Exam: Vitals reviewed with the nursing staff today GEN- The patient is overweight appearing, alert and oriented x 3 today.   Head- normocephalic, atraumatic Eyes-  Sclera clear, conjunctiva pink Ears- hearing intact Oropharynx- clear Neck- supple, no JVP Lymph- no cervical lymphadenopathy Lungs- Clear to ausculation bilaterally, normal work of breathing Heart- Regular rate and rhythm, no murmurs, rubs or gallops, PMI not laterally displaced GI- soft, NT, ND, + BS Extremities- no clubbing, cyanosis, + chronic lymphedema both legs MS- age appropriate atrophy   ekg today reveals sinus  64 bpm, PR 204, QRS 100, inferior infarction  Assessment and Plan:  1. afib Well controlled No changes at this time She continues to decline anticoagulation  2. HTN Stable No change required today  Return in 1year

## 2013-05-25 NOTE — Patient Instructions (Signed)
Your physician wants you to follow-up in: 12 months with Dr Allred You will receive a reminder letter in the mail two months in advance. If you don't receive a letter, please call our office to schedule the follow-up appointment.  

## 2013-05-27 ENCOUNTER — Telehealth: Payer: Self-pay | Admitting: Internal Medicine

## 2013-05-27 NOTE — Telephone Encounter (Signed)
New problem    Calling you back regarding metoprolol prescription

## 2013-05-27 NOTE — Telephone Encounter (Signed)
Never received RX.  Gave verbal over the phone

## 2013-06-15 ENCOUNTER — Telehealth: Payer: Self-pay | Admitting: Internal Medicine

## 2013-06-15 NOTE — Telephone Encounter (Signed)
New Prob   Pt has some questions regarding to her METOPROLOL pills. Please call.

## 2013-06-15 NOTE — Telephone Encounter (Addendum)
She was worried because the pill is so small, she is going to buy a pill cutter and cut the ones she has in half.  When she gets the next RX filled she will get 12.5mg  twice daily

## 2013-07-11 ENCOUNTER — Telehealth: Payer: Self-pay | Admitting: Internal Medicine

## 2013-07-11 MED ORDER — METOPROLOL TARTRATE 12.5 MG HALF TABLET: ORAL_TABLET | ORAL | Status: DC

## 2013-07-11 NOTE — Telephone Encounter (Signed)
New Prob  Pt wants to speak with you regarding a medication that she placed on by her PCP.

## 2013-07-11 NOTE — Telephone Encounter (Signed)
Called patient and spoke with her, will call her in Metoprolol 12.5 mg bid.  Okay to take her Celebrex given to her by her PCP.  Discussed with Weston Brass, Pharm D, no interaction with Rythmol patient aware

## 2013-07-13 ENCOUNTER — Telehealth: Payer: Self-pay

## 2013-07-13 NOTE — Telephone Encounter (Signed)
PHARMACIST VERIFIED THAT  METOPROLOL   DOES NOT COME IN A 12.5 MG PILL ANYMORE. SO I SPOKE WITH KELLY RN AND LET HER KNOW PT CONCERNS ABOUT BREAKING THE 25MG  PILL IN HALF.( TO EQUAL 12.5 MG TWICE A DAY) PT FEELS AS THOUGH SHE IS LOSING TO MUCH OF THE PILL ONCE ITS BROKEN IN HALF. KELLY WANTED ME TO INFORM THE PT THAT SHE HAD TO STAY ON THIS DOSE - THAT  THERE WAS NO OTHER WAY TO DO THIS AT THIS TIME . PT AGREED AND WILL CONTINUE TO CUT PILL HALF

## 2014-05-18 ENCOUNTER — Other Ambulatory Visit: Payer: Self-pay | Admitting: Internal Medicine

## 2014-05-26 ENCOUNTER — Other Ambulatory Visit: Payer: Self-pay | Admitting: Internal Medicine

## 2014-06-21 ENCOUNTER — Encounter: Payer: Self-pay | Admitting: Internal Medicine

## 2014-06-21 ENCOUNTER — Ambulatory Visit (INDEPENDENT_AMBULATORY_CARE_PROVIDER_SITE_OTHER): Payer: Medicare Other | Admitting: Internal Medicine

## 2014-06-21 VITALS — BP 123/67 | HR 57 | Ht 64.0 in | Wt 229.4 lb

## 2014-06-21 DIAGNOSIS — I10 Essential (primary) hypertension: Secondary | ICD-10-CM

## 2014-06-21 DIAGNOSIS — I4819 Other persistent atrial fibrillation: Secondary | ICD-10-CM

## 2014-06-21 DIAGNOSIS — I4891 Unspecified atrial fibrillation: Secondary | ICD-10-CM

## 2014-06-21 MED ORDER — LISINOPRIL 40 MG PO TABS: ORAL_TABLET | ORAL | Status: AC

## 2014-06-21 MED ORDER — PROPAFENONE HCL 225 MG PO TABS: ORAL_TABLET | ORAL | Status: DC

## 2014-06-21 NOTE — Progress Notes (Signed)
Erika Curia, MD: Primary Cardiologist:  Previously Dr Kirke Corin in [facility] Erika Holmes is a 68 y.o. female with a h/o atrial fibrillation who presents today for follow-up.   Maintaining NSR on  Propafenone and metoprolol.  Her primary concern is with arthritic knee pain.  Today, she  denies symptoms of chest pain, shortness of breath, orthopnea, PND, dizziness, presyncope, syncope, or neurologic sequela.  She has chronic lymphedema which continues to be a struggle, wears ted hose and has pnuematic leg wraps to use prn.  She previously took coumadin but stopped this medicine due to labile INRs. Risk of stroke discussed with patient again today with a Chads2vasc score of 3. She continues to defer anticoagulants other that ASA.   The patientis tolerating medications without difficulties and is otherwise without complaint today.   Past Medical History  Diagnosis Date  . Lymphedema   . Right leg injury   . Asthma   . Fatigue   . Arthritis   . Anxiety and depression   . Hypertension   . Persistent atrial fibrillation    History reviewed. No pertinent past surgical history.  History   Social History  . Marital Status: Widowed    Spouse Name: N/A    Number of Children: N/A  . Years of Education: N/A   Occupational History  . Not on file.   Social History Main Topics  . Smoking status: Never Smoker   . Smokeless tobacco: Not on file  . Alcohol Use: No  . Drug Use: No  . Sexual Activity: Not on file   Other Topics Concern  . Not on file   Social History Narrative   Lives in [place]    No family history on file.  Allergies  Allergen Reactions  . Ciprofloxacin Other (See Comments)    Cannot take because of kidney function  . Macrobid [employer] Macro] Other (See Comments)    Cannot take because of kidney function  . Codeine     Current Outpatient Prescriptions  Medication Sig Dispense Refill  . ALPRAZolam (XANAX) 0.5 MG tablet Take 0.5 mg by mouth every 8  (eight) hours as needed for anxiety.      Marland Kitchen amoxicillin (AMOXIL) 500 MG capsule Take 500 mg by mouth 3 (three) times daily.      Marland Kitchen aspirin 81 MG tablet Take 81 mg by mouth daily.        Marland Kitchen lisinopril (PRINIVIL,ZESTRIL) 40 MG tablet TAKE ONE (1) TABLET EACH DAY  90 tablet  0  . metoprolol succinate (TOPROL-XL) 50 MG 24 hr tablet Take 50 mg by mouth daily. Take with or immediately following a meal.      . omeprazole (PRILOSEC OTC) 20 MG tablet Take 20 mg by mouth as needed.      . OXYCODONE-ACETAMINOPHEN PO Take 5-325 mg by mouth every 6 (six) hours as needed.       . promethazine (PHENERGAN) 25 MG tablet Take 25 mg by mouth every 6 (six) hours as needed.        . propafenone (RYTHMOL) 225 MG tablet TAKE ONE TABLET BY MOUTH EVERY 8 HOURS.  270 tablet  0  . zolpidem (AMBIEN) 10 MG tablet Take 10 mg by mouth at bedtime as needed.         No current facility-administered medications for this visit.    Physical Exam: Vitals reviewed with the nursing staff today GEN- The patient is overweight appearing, alert and oriented x 3 today.  In a wheelchair  due to arthritic knees. Head- normocephalic, atraumatic Eyes-  Sclera clear, conjunctiva pink Ears- hearing intact Oropharynx- clear Neck- supple, no JVP Lymph- no cervical lymphadenopathy Lungs- Clear to ausculation bilaterally, normal work of breathing Heart- Regular rate and rhythm, no murmurs, rubs or gallops, PMI not laterally displaced GI- soft, NT, ND, + BS Extremities- no clubbing, cyanosis, + chronic lymphedema both legs MS- age appropriate atrophy   ekg today reveals sinus 64 bpm, PR 204, QRS 100, inferior infarction  Assessment and Plan:  1. afib Maintaining NSR on rhythmol She again declines anticoagulation at this time No changes at this time.  2. CHads2vasc score of 3. Risk of stroke again addressed with pt and she respectfully declines anticoagulation. Continue asa.    2. HTN Well managed, no change.   Return in  1year

## 2014-06-21 NOTE — Patient Instructions (Signed)
Your physician wants you to follow-up in: 12 months with Dr Allred You will receive a reminder letter in the mail two months in advance. If you don't receive a letter, please call our office to schedule the follow-up appointment.  

## 2014-10-30 ENCOUNTER — Telehealth: Payer: Self-pay | Admitting: Internal Medicine

## 2014-10-30 NOTE — Telephone Encounter (Signed)
Spoke with patient, her PCP and PT are worried with a HR of 52.  She has been feeling fine.  Says is somewhat fatigued and has had some dizziness but have to pill that out of her.  They have asked she decrease her dose of Metoprolol to 25mg  daily not twice daily

## 2014-10-30 NOTE — Telephone Encounter (Signed)
New message     Pt says her pulse is 52.  Her PCP doctor want pt to call heart doctor to see if 25 is ok.

## 2014-12-12 ENCOUNTER — Telehealth: Payer: Self-pay | Admitting: Internal Medicine

## 2014-12-12 NOTE — Telephone Encounter (Signed)
New message     Pt states yesterday and today her heart feels like it is quivering.  This happened twice yesterday and twice today.  Her pulse is 48.  Pt states she has afib at times.  Pt took an anxiety pill ago.  Pt says she does not feel right.  Please advise

## 2014-12-12 NOTE — Telephone Encounter (Signed)
Called and spoke with patient.  Yesterday and today had 2 spells, feels like her heart is jumping.  Does not feel like afib, but she has been having SOB off and on (if she hurries or after taking a shower) for about a week.  Offered her an appointment tomorrow

## 2014-12-13 ENCOUNTER — Encounter: Payer: Self-pay | Admitting: Internal Medicine

## 2014-12-13 ENCOUNTER — Ambulatory Visit (INDEPENDENT_AMBULATORY_CARE_PROVIDER_SITE_OTHER): Payer: Medicare Other | Admitting: Internal Medicine

## 2014-12-13 VITALS — BP 90/60 | HR 51 | Ht 63.0 in

## 2014-12-13 DIAGNOSIS — I481 Persistent atrial fibrillation: Secondary | ICD-10-CM

## 2014-12-13 DIAGNOSIS — I10 Essential (primary) hypertension: Secondary | ICD-10-CM

## 2014-12-13 DIAGNOSIS — I4819 Other persistent atrial fibrillation: Secondary | ICD-10-CM

## 2014-12-13 MED ORDER — PROPAFENONE HCL 225 MG PO TABS: 225.0000 mg | ORAL_TABLET | Freq: Two times a day (BID) | ORAL | Status: AC

## 2014-12-13 MED ORDER — METOPROLOL SUCCINATE ER 25 MG PO TB24: 12.5000 mg | ORAL_TABLET | Freq: Every day | ORAL | Status: AC

## 2014-12-13 NOTE — Patient Instructions (Addendum)
Your physician recommends that you schedule a follow-up appointment in 4 WEEKS with Rudi Coco, NP at [facility] locations.  Your physician has recommended you make the following change in your medication:  1) Rythmol 225 mg tab by mouth twice a day  2) Metoprolol Succinate 1 half a tab by mouth daily

## 2014-12-13 NOTE — Progress Notes (Signed)
[name], MD: Primary Cardiologist:  Previously Dr Kirke Corin in [facility] Erika Holmes is a 68 y.o. female with a h/o atrial fibrillation who presents today for follow-up. Had been doing well,  maintaining NSR on  Propafenone and metoprolol.  Noticed on Sunday some new episodes of fluttering in her chest that felt different than her previous afib episodes. Would take xanax and heart rate would calm down. She has also noticed heart rates in the upper 40's lately.  Did not take metoprolol today and has a heart rate of 51 with a junctional rhythm in the office. Still with decreased activity and deconditioning basically wheelchair bound due to knee issues and lymphedema.. Has dyspnea with exertional issues which is chronic.   Today, she  denies symptoms of chest pain,  orthopnea, PND, dizziness, presyncope, syncope, or neurologic sequela.  She has chronic lymphedema which continues to be a struggle, wears ted hose and has pnuematic leg wraps to use prn.  She previously took coumadin but stopped this medicine due to labile INRs. Risk of stroke discussed with patient  with a Chads2vasc score of 3. She continues to defer anticoagulants other that ASA.    Past Medical History  Diagnosis Date  . Lymphedema   . Right leg injury   . Asthma   . Fatigue   . Arthritis   . Anxiety and depression   . Hypertension   . Persistent atrial fibrillation    History reviewed. No pertinent past surgical history.  History   Social History  . Marital Status: Widowed    Spouse Name: N/A    Number of Children: N/A  . Years of Education: N/A   Occupational History  . Not on file.   Social History Main Topics  . Smoking status: Never Smoker   . Smokeless tobacco: Not on file  . Alcohol Use: No  . Drug Use: No  . Sexual Activity: Not on file   Other Topics Concern  . Not on file   Social History Narrative   Lives in [place]    Family History  Problem Relation Age of Onset  . Heart disease Mother    . Heart disease Father   . Heart disease Sister     Allergies  Allergen Reactions  . Ciprofloxacin Other (See Comments)    Cannot take because of kidney function  . Macrobid [employer] Macro] Other (See Comments)    Cannot take because of kidney function  . Codeine     Current Outpatient Prescriptions  Medication Sig Dispense Refill  . ALPRAZolam (XANAX) 0.5 MG tablet Take 0.5 mg by mouth every 8 (eight) hours as needed for anxiety.    Marland Kitchen aspirin 81 MG tablet Take 81 mg by mouth daily.      Marland Kitchen lisinopril (PRINIVIL,ZESTRIL) 40 MG tablet TAKE ONE (1) TABLET EACH DAY 90 tablet 3  . metoprolol succinate (TOPROL-XL) 25 MG 24 hr tablet Take 0.5 tablets (12.5 mg total) by mouth daily. Take with or immediately following a meal. 45 tablet 3  . omeprazole (PRILOSEC OTC) 20 MG tablet Take 20 mg by mouth as needed.    . propafenone (RYTHMOL) 225 MG tablet Take 1 tablet (225 mg total) by mouth 2 (two) times daily. TAKE ONE TABLET BY MOUTH TWICE DAILY. 180 tablet 3  . zolpidem (AMBIEN) 10 MG tablet Take 10 mg by mouth at bedtime as needed.      . OXYCODONE-ACETAMINOPHEN PO Take 5-325 mg by mouth every 6 (six) hours as needed.     Marland Kitchen  promethazine (PHENERGAN) 25 MG tablet Take 25 mg by mouth every 6 (six) hours as needed.       No current facility-administered medications for this visit.    Physical Exam: Bp 90/60, rechecked 108/60, HR 51 bpm GEN- The patient is overweight appearing, alert and oriented x 3 today.  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 Head- normocephalic, atraumatic Eyes-  Sclera clear, conjunctiva pink Ears- hearing intact Oropharynx- clear Neck- supple, no JVP Lymph- no cervical lymphadenopathy Lungs- Clear to ausculation bilaterally, normal work of breathing Heart- Regular rate and rhythm, no murmurs, rubs or gallops, PMI not laterally displaced GI- soft, NT, ND, + BS Extremities- no clubbing, cyanosis, + chronic lymphedema both legs MS- age appropriate  atrophy   ekg today reveals junctional rhythm  51 bpm, LAD, septal infarct, age undetermined   Assessment and Plan:  1. afib Maintaining NSR on Rythmol but with recent bradycardia and junctional rhythm. Decrease  Rythmol to 225 mg bid. Decrease metoprolol to 25 mg, 1/2 tab a day.  2. CHads2vasc score of 3. Risk of stroke  addressed with pt and she respectfully declines anticoagulation. Continue asa.    2. HTN BP has been  running lower lately. Medication changes should help with hypotension.   Return in 4 weeks with Rudi Coco.

## 2014-12-25 DIAGNOSIS — J449 Chronic obstructive pulmonary disease, unspecified: Secondary | ICD-10-CM | POA: Diagnosis not present

## 2014-12-25 DIAGNOSIS — M25561 Pain in right knee: Secondary | ICD-10-CM | POA: Diagnosis not present

## 2014-12-25 DIAGNOSIS — E8881 Metabolic syndrome: Secondary | ICD-10-CM | POA: Diagnosis not present

## 2014-12-25 DIAGNOSIS — E780 Pure hypercholesterolemia: Secondary | ICD-10-CM | POA: Diagnosis not present

## 2014-12-25 DIAGNOSIS — I10 Essential (primary) hypertension: Secondary | ICD-10-CM | POA: Diagnosis not present

## 2015-01-10 ENCOUNTER — Ambulatory Visit: Payer: Medicare Other | Admitting: Nurse Practitioner

## 2015-01-22 DIAGNOSIS — M25561 Pain in right knee: Secondary | ICD-10-CM | POA: Diagnosis not present

## 2015-01-22 DIAGNOSIS — E780 Pure hypercholesterolemia: Secondary | ICD-10-CM | POA: Diagnosis not present

## 2015-01-22 DIAGNOSIS — E8881 Metabolic syndrome: Secondary | ICD-10-CM | POA: Diagnosis not present

## 2015-01-22 DIAGNOSIS — I10 Essential (primary) hypertension: Secondary | ICD-10-CM | POA: Diagnosis not present

## 2015-01-22 DIAGNOSIS — J449 Chronic obstructive pulmonary disease, unspecified: Secondary | ICD-10-CM | POA: Diagnosis not present

## 2015-01-29 DIAGNOSIS — E780 Pure hypercholesterolemia: Secondary | ICD-10-CM | POA: Diagnosis not present

## 2015-01-29 DIAGNOSIS — E8881 Metabolic syndrome: Secondary | ICD-10-CM | POA: Diagnosis not present

## 2015-01-29 DIAGNOSIS — M25561 Pain in right knee: Secondary | ICD-10-CM | POA: Diagnosis not present

## 2015-01-29 DIAGNOSIS — J449 Chronic obstructive pulmonary disease, unspecified: Secondary | ICD-10-CM | POA: Diagnosis not present

## 2015-01-29 DIAGNOSIS — I10 Essential (primary) hypertension: Secondary | ICD-10-CM | POA: Diagnosis not present

## 2015-02-15 ENCOUNTER — Ambulatory Visit (INDEPENDENT_AMBULATORY_CARE_PROVIDER_SITE_OTHER): Payer: Medicare Other | Admitting: Nurse Practitioner

## 2015-02-15 ENCOUNTER — Encounter: Payer: Self-pay | Admitting: Nurse Practitioner

## 2015-02-15 VITALS — BP 102/60 | HR 58 | Ht 64.0 in | Wt 237.0 lb

## 2015-02-15 DIAGNOSIS — I4891 Unspecified atrial fibrillation: Secondary | ICD-10-CM | POA: Diagnosis not present

## 2015-02-15 NOTE — Progress Notes (Signed)
Erika Curia, MD: Primary Cardiologist:  Previously Dr Kirke Corin in [facility] Erika Holmes is a 69 y.o. female with a h/o atrial fibrillation who presents today for follow-up. On last visit, she was having bradycardia and a junctional pacemaker so Rythmol and Toprol doses were reduced. She has some palpitations when first trying to go to sleep at night but has not noticed any afib . Heart rate today improved S  Brady at 58 bpm . She reports more issues with knees and recently required a cortisone shot. Starting crying today stating she is depressed because she can't get out of the house.    Today, she  denies symptoms of chest pain,  orthopnea, PND, dizziness, presyncope, syncope, or neurologic sequela.  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  She previously took coumadin but stopped this medicine due to labile INRs. Risk of stroke discussed with patient  with a Chads2vasc score of 3. She continues to defer anticoagulants other that ASA.    Past Medical History  Diagnosis Date  . Lymphedema   . Right leg injury   . Asthma   . Fatigue   . Arthritis   . Anxiety and depression   . Hypertension   . Persistent atrial fibrillation    No past surgical history on file.  History   Social History  . Marital Status: Widowed    Spouse Name: N/A  . Number of Children: N/A  . Years of Education: N/A   Occupational History  . Not on file.   Social History Main Topics  . Smoking status: Never Smoker   . Smokeless tobacco: Not on file  . Alcohol Use: No  . Drug Use: No  . Sexual Activity: Not on file   Other Topics Concern  . Not on file   Social History Narrative   Lives in [place]    Family History  Problem Relation Age of Onset  . Heart disease Mother   . Heart disease Father   . Heart disease Sister     Allergies  Allergen Reactions  . Ciprofloxacin Other  (See Comments)    Cannot take because of kidney function  . Macrobid [employer] Macro] Other (See Comments)    Cannot take because of kidney function  . Codeine     Current Outpatient Prescriptions  Medication Sig Dispense Refill  . acetaminophen (TYLENOL) 650 MG CR tablet Take 650 mg by mouth every 8 (eight) hours as needed for pain.    Marland Kitchen ALPRAZolam (XANAX) 0.5 MG tablet Take 0.5 mg by mouth every 8 (eight) hours as needed for anxiety.    Marland Kitchen aspirin 81 MG tablet Take 81 mg by mouth daily.      . Aspirin-Acetaminophen 500-325 MG PACK Take 1 packet by mouth.    Marland Kitchen lisinopril (PRINIVIL,ZESTRIL) 40 MG tablet TAKE ONE (1) TABLET EACH DAY 90 tablet 3  . metoprolol succinate (TOPROL-XL) 25 MG 24 hr tablet Take 0.5 tablets (12.5 mg total) by mouth daily. Take with or immediately following a meal. 45 tablet 3  . omeprazole (PRILOSEC OTC) 20 MG tablet Take 20 mg by mouth as needed.    . OXYCODONE-ACETAMINOPHEN PO Take 5-325 mg by mouth every 6 (six) hours as needed.     . promethazine (PHENERGAN) 25 MG tablet Take 25 mg by mouth every 6 (six) hours as needed.      Marland Kitchen  propafenone (RYTHMOL) 225 MG tablet Take 1 tablet (225 mg total) by mouth 2 (two) times daily. TAKE ONE TABLET BY MOUTH TWICE DAILY. 180 tablet 3  . zolpidem (AMBIEN) 10 MG tablet Take 10 mg by mouth at bedtime as needed.       No current facility-administered medications for this visit.    Physical Exam: Bp 102/76, HR 58 bpm, GEN- The patient is overweight appearing, alert and oriented x 3 today.  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 Head- normocephalic, atraumatic Eyes-  Sclera clear, conjunctiva pink Ears- hearing intact Oropharynx- clear Neck- supple, no JVP Lymph- no cervical lymphadenopathy Lungs- Clear to ausculation bilaterally, normal work of breathing Heart- Regular rate and rhythm, no murmurs, rubs or gallops, PMI not laterally displaced GI- soft, NT, ND, + BS Extremities- no clubbing, cyanosis, +  chronic lymphedema both legs MS- age appropriate atrophy   ekg today reveals junctional rhythm  51 bpm, LAD, septal infarct, age undetermined   Assessment and Plan:  1. afib Maintaining SR on Rythmol with less brady on reduced dose as well as decreased dose of metoprolol  Continue current doses.  2. CHads2vasc score of 3. Risk of stroke  addressed with pt and she respectfully declines anticoagulation. Continue asa.    3. HTN BP has been  running lower lately. Can discuss with PCP who she sees on Monday as well as issues with depression, The granddaughter states that PCP has wanted to put her on depression medication in the past but she deferred.  4. F/u with Dr. Johney Frame in December 2016.  Return in 4 weeks with Rudi Coco.

## 2015-02-15 NOTE — Patient Instructions (Signed)
Your physician wants you to follow-up in: 9 months with Dr Allred You will receive a reminder letter in the mail two months in advance. If you don't receive a letter, please call our office to schedule the follow-up appointment.  

## 2015-02-16 ENCOUNTER — Ambulatory Visit (HOSPITAL_COMMUNITY): Payer: Self-pay | Admitting: Nurse Practitioner

## 2015-02-19 DIAGNOSIS — E8881 Metabolic syndrome: Secondary | ICD-10-CM | POA: Diagnosis not present

## 2015-02-19 DIAGNOSIS — E780 Pure hypercholesterolemia: Secondary | ICD-10-CM | POA: Diagnosis not present

## 2015-02-19 DIAGNOSIS — M25561 Pain in right knee: Secondary | ICD-10-CM | POA: Diagnosis not present

## 2015-02-19 DIAGNOSIS — J449 Chronic obstructive pulmonary disease, unspecified: Secondary | ICD-10-CM | POA: Diagnosis not present

## 2015-02-19 DIAGNOSIS — F329 Major depressive disorder, single episode, unspecified: Secondary | ICD-10-CM | POA: Diagnosis not present

## 2015-03-19 DIAGNOSIS — E780 Pure hypercholesterolemia: Secondary | ICD-10-CM | POA: Diagnosis not present

## 2015-03-19 DIAGNOSIS — F1920 Other psychoactive substance dependence, uncomplicated: Secondary | ICD-10-CM | POA: Diagnosis not present

## 2015-03-19 DIAGNOSIS — Z1389 Encounter for screening for other disorder: Secondary | ICD-10-CM | POA: Diagnosis not present

## 2015-03-19 DIAGNOSIS — Z79899 Other long term (current) drug therapy: Secondary | ICD-10-CM | POA: Diagnosis not present

## 2015-03-19 DIAGNOSIS — N390 Urinary tract infection, site not specified: Secondary | ICD-10-CM | POA: Diagnosis not present

## 2015-03-19 DIAGNOSIS — E8881 Metabolic syndrome: Secondary | ICD-10-CM | POA: Diagnosis not present

## 2015-03-19 DIAGNOSIS — Z9181 History of falling: Secondary | ICD-10-CM | POA: Diagnosis not present

## 2015-03-26 DIAGNOSIS — K449 Diaphragmatic hernia without obstruction or gangrene: Secondary | ICD-10-CM | POA: Diagnosis not present

## 2015-03-26 DIAGNOSIS — R197 Diarrhea, unspecified: Secondary | ICD-10-CM | POA: Diagnosis not present

## 2015-03-26 DIAGNOSIS — E860 Dehydration: Secondary | ICD-10-CM | POA: Diagnosis not present

## 2015-03-26 DIAGNOSIS — R112 Nausea with vomiting, unspecified: Secondary | ICD-10-CM | POA: Diagnosis not present

## 2015-04-16 DIAGNOSIS — J449 Chronic obstructive pulmonary disease, unspecified: Secondary | ICD-10-CM | POA: Diagnosis not present

## 2015-04-16 DIAGNOSIS — I10 Essential (primary) hypertension: Secondary | ICD-10-CM | POA: Diagnosis not present

## 2015-04-16 DIAGNOSIS — R112 Nausea with vomiting, unspecified: Secondary | ICD-10-CM | POA: Diagnosis not present

## 2015-04-16 DIAGNOSIS — E8881 Metabolic syndrome: Secondary | ICD-10-CM | POA: Diagnosis not present

## 2015-04-16 DIAGNOSIS — E780 Pure hypercholesterolemia: Secondary | ICD-10-CM | POA: Diagnosis not present

## 2015-04-26 DIAGNOSIS — R1011 Right upper quadrant pain: Secondary | ICD-10-CM | POA: Diagnosis not present

## 2015-04-26 DIAGNOSIS — R112 Nausea with vomiting, unspecified: Secondary | ICD-10-CM | POA: Diagnosis not present

## 2015-04-26 DIAGNOSIS — R079 Chest pain, unspecified: Secondary | ICD-10-CM | POA: Diagnosis not present

## 2015-04-30 DIAGNOSIS — E780 Pure hypercholesterolemia: Secondary | ICD-10-CM | POA: Diagnosis not present

## 2015-04-30 DIAGNOSIS — I10 Essential (primary) hypertension: Secondary | ICD-10-CM | POA: Diagnosis not present

## 2015-04-30 DIAGNOSIS — R110 Nausea: Secondary | ICD-10-CM | POA: Diagnosis not present

## 2015-04-30 DIAGNOSIS — F329 Major depressive disorder, single episode, unspecified: Secondary | ICD-10-CM | POA: Diagnosis not present

## 2015-04-30 DIAGNOSIS — J449 Chronic obstructive pulmonary disease, unspecified: Secondary | ICD-10-CM | POA: Diagnosis not present

## 2015-05-08 DIAGNOSIS — K210 Gastro-esophageal reflux disease with esophagitis: Secondary | ICD-10-CM | POA: Diagnosis not present

## 2015-05-08 DIAGNOSIS — R112 Nausea with vomiting, unspecified: Secondary | ICD-10-CM | POA: Diagnosis not present

## 2015-05-09 DIAGNOSIS — I480 Paroxysmal atrial fibrillation: Secondary | ICD-10-CM | POA: Diagnosis not present

## 2015-05-09 DIAGNOSIS — K210 Gastro-esophageal reflux disease with esophagitis: Secondary | ICD-10-CM | POA: Diagnosis not present

## 2015-05-09 DIAGNOSIS — K219 Gastro-esophageal reflux disease without esophagitis: Secondary | ICD-10-CM | POA: Diagnosis not present

## 2015-05-09 DIAGNOSIS — F419 Anxiety disorder, unspecified: Secondary | ICD-10-CM | POA: Diagnosis not present

## 2015-05-09 DIAGNOSIS — I2510 Atherosclerotic heart disease of native coronary artery without angina pectoris: Secondary | ICD-10-CM | POA: Diagnosis not present

## 2015-05-09 DIAGNOSIS — K222 Esophageal obstruction: Secondary | ICD-10-CM | POA: Diagnosis not present

## 2015-05-09 DIAGNOSIS — J45909 Unspecified asthma, uncomplicated: Secondary | ICD-10-CM | POA: Diagnosis not present

## 2015-05-09 DIAGNOSIS — Z9049 Acquired absence of other specified parts of digestive tract: Secondary | ICD-10-CM | POA: Diagnosis not present

## 2015-05-09 DIAGNOSIS — T40605D Adverse effect of unspecified narcotics, subsequent encounter: Secondary | ICD-10-CM | POA: Diagnosis not present

## 2015-05-09 DIAGNOSIS — K5790 Diverticulosis of intestine, part unspecified, without perforation or abscess without bleeding: Secondary | ICD-10-CM | POA: Diagnosis not present

## 2015-05-09 DIAGNOSIS — K319 Disease of stomach and duodenum, unspecified: Secondary | ICD-10-CM | POA: Diagnosis not present

## 2015-05-09 DIAGNOSIS — K449 Diaphragmatic hernia without obstruction or gangrene: Secondary | ICD-10-CM | POA: Diagnosis not present

## 2015-05-09 DIAGNOSIS — I10 Essential (primary) hypertension: Secondary | ICD-10-CM | POA: Diagnosis not present

## 2015-05-09 DIAGNOSIS — R112 Nausea with vomiting, unspecified: Secondary | ICD-10-CM | POA: Diagnosis not present

## 2015-05-09 DIAGNOSIS — K2950 Unspecified chronic gastritis without bleeding: Secondary | ICD-10-CM | POA: Diagnosis not present

## 2015-05-09 DIAGNOSIS — K259 Gastric ulcer, unspecified as acute or chronic, without hemorrhage or perforation: Secondary | ICD-10-CM | POA: Diagnosis not present

## 2015-05-09 DIAGNOSIS — K580 Irritable bowel syndrome with diarrhea: Secondary | ICD-10-CM | POA: Diagnosis not present

## 2015-05-09 DIAGNOSIS — K228 Other specified diseases of esophagus: Secondary | ICD-10-CM | POA: Diagnosis not present

## 2015-05-15 DIAGNOSIS — R110 Nausea: Secondary | ICD-10-CM | POA: Diagnosis not present

## 2015-05-15 DIAGNOSIS — J449 Chronic obstructive pulmonary disease, unspecified: Secondary | ICD-10-CM | POA: Diagnosis not present

## 2015-05-15 DIAGNOSIS — I10 Essential (primary) hypertension: Secondary | ICD-10-CM | POA: Diagnosis not present

## 2015-05-15 DIAGNOSIS — E780 Pure hypercholesterolemia: Secondary | ICD-10-CM | POA: Diagnosis not present

## 2015-05-15 DIAGNOSIS — F329 Major depressive disorder, single episode, unspecified: Secondary | ICD-10-CM | POA: Diagnosis not present

## 2015-05-17 DIAGNOSIS — R110 Nausea: Secondary | ICD-10-CM | POA: Diagnosis not present

## 2015-05-17 DIAGNOSIS — R1010 Upper abdominal pain, unspecified: Secondary | ICD-10-CM | POA: Diagnosis not present

## 2015-05-17 DIAGNOSIS — R112 Nausea with vomiting, unspecified: Secondary | ICD-10-CM | POA: Diagnosis not present

## 2015-05-17 DIAGNOSIS — R079 Chest pain, unspecified: Secondary | ICD-10-CM | POA: Diagnosis not present

## 2015-05-17 DIAGNOSIS — R1013 Epigastric pain: Secondary | ICD-10-CM | POA: Diagnosis not present

## 2015-05-17 DIAGNOSIS — R109 Unspecified abdominal pain: Secondary | ICD-10-CM | POA: Diagnosis not present

## 2015-06-06 DIAGNOSIS — R1013 Epigastric pain: Secondary | ICD-10-CM | POA: Diagnosis not present

## 2015-06-14 DIAGNOSIS — R1011 Right upper quadrant pain: Secondary | ICD-10-CM | POA: Diagnosis not present

## 2015-06-14 DIAGNOSIS — K828 Other specified diseases of gallbladder: Secondary | ICD-10-CM | POA: Diagnosis not present

## 2015-06-14 DIAGNOSIS — J449 Chronic obstructive pulmonary disease, unspecified: Secondary | ICD-10-CM | POA: Diagnosis not present

## 2015-06-14 DIAGNOSIS — R1013 Epigastric pain: Secondary | ICD-10-CM | POA: Diagnosis not present

## 2015-06-14 DIAGNOSIS — F329 Major depressive disorder, single episode, unspecified: Secondary | ICD-10-CM | POA: Diagnosis not present

## 2015-06-14 DIAGNOSIS — K811 Chronic cholecystitis: Secondary | ICD-10-CM | POA: Diagnosis not present

## 2015-06-14 DIAGNOSIS — I10 Essential (primary) hypertension: Secondary | ICD-10-CM | POA: Diagnosis not present

## 2015-06-14 DIAGNOSIS — E780 Pure hypercholesterolemia: Secondary | ICD-10-CM | POA: Diagnosis not present

## 2015-06-14 DIAGNOSIS — E8881 Metabolic syndrome: Secondary | ICD-10-CM | POA: Diagnosis not present

## 2015-06-16 DIAGNOSIS — I10 Essential (primary) hypertension: Secondary | ICD-10-CM | POA: Diagnosis not present

## 2015-06-16 DIAGNOSIS — R4781 Slurred speech: Secondary | ICD-10-CM | POA: Diagnosis not present

## 2015-06-16 DIAGNOSIS — N390 Urinary tract infection, site not specified: Secondary | ICD-10-CM | POA: Diagnosis not present

## 2015-06-16 DIAGNOSIS — J45909 Unspecified asthma, uncomplicated: Secondary | ICD-10-CM | POA: Diagnosis not present

## 2015-06-16 DIAGNOSIS — K219 Gastro-esophageal reflux disease without esophagitis: Secondary | ICD-10-CM | POA: Diagnosis not present

## 2015-06-16 DIAGNOSIS — R404 Transient alteration of awareness: Secondary | ICD-10-CM | POA: Diagnosis not present

## 2015-06-16 DIAGNOSIS — Z7982 Long term (current) use of aspirin: Secondary | ICD-10-CM | POA: Diagnosis not present

## 2015-06-16 DIAGNOSIS — R531 Weakness: Secondary | ICD-10-CM | POA: Diagnosis not present

## 2015-06-16 DIAGNOSIS — I517 Cardiomegaly: Secondary | ICD-10-CM | POA: Diagnosis not present

## 2015-06-16 DIAGNOSIS — E860 Dehydration: Secondary | ICD-10-CM | POA: Diagnosis not present

## 2015-06-16 DIAGNOSIS — Z79899 Other long term (current) drug therapy: Secondary | ICD-10-CM | POA: Diagnosis not present

## 2015-06-16 DIAGNOSIS — R918 Other nonspecific abnormal finding of lung field: Secondary | ICD-10-CM | POA: Diagnosis not present

## 2015-06-16 DIAGNOSIS — R0989 Other specified symptoms and signs involving the circulatory and respiratory systems: Secondary | ICD-10-CM | POA: Diagnosis not present

## 2015-06-16 DIAGNOSIS — R4182 Altered mental status, unspecified: Secondary | ICD-10-CM | POA: Diagnosis not present

## 2015-06-20 DIAGNOSIS — I10 Essential (primary) hypertension: Secondary | ICD-10-CM | POA: Diagnosis not present

## 2015-06-20 DIAGNOSIS — K8010 Calculus of gallbladder with chronic cholecystitis without obstruction: Secondary | ICD-10-CM | POA: Diagnosis not present

## 2015-06-20 DIAGNOSIS — Z884 Allergy status to anesthetic agent status: Secondary | ICD-10-CM | POA: Diagnosis not present

## 2015-06-20 DIAGNOSIS — D649 Anemia, unspecified: Secondary | ICD-10-CM | POA: Diagnosis not present

## 2015-06-20 DIAGNOSIS — Z452 Encounter for adjustment and management of vascular access device: Secondary | ICD-10-CM | POA: Diagnosis not present

## 2015-06-20 DIAGNOSIS — Z6834 Body mass index (BMI) 34.0-34.9, adult: Secondary | ICD-10-CM | POA: Diagnosis not present

## 2015-06-20 DIAGNOSIS — N3000 Acute cystitis without hematuria: Secondary | ICD-10-CM | POA: Diagnosis not present

## 2015-06-20 DIAGNOSIS — K449 Diaphragmatic hernia without obstruction or gangrene: Secondary | ICD-10-CM | POA: Diagnosis not present

## 2015-06-20 DIAGNOSIS — I480 Paroxysmal atrial fibrillation: Secondary | ICD-10-CM | POA: Diagnosis not present

## 2015-06-20 DIAGNOSIS — I999 Unspecified disorder of circulatory system: Secondary | ICD-10-CM | POA: Diagnosis not present

## 2015-06-20 DIAGNOSIS — Z885 Allergy status to narcotic agent status: Secondary | ICD-10-CM | POA: Diagnosis not present

## 2015-06-20 DIAGNOSIS — I4891 Unspecified atrial fibrillation: Secondary | ICD-10-CM | POA: Diagnosis not present

## 2015-06-20 DIAGNOSIS — R339 Retention of urine, unspecified: Secondary | ICD-10-CM | POA: Diagnosis not present

## 2015-06-20 DIAGNOSIS — K219 Gastro-esophageal reflux disease without esophagitis: Secondary | ICD-10-CM | POA: Diagnosis not present

## 2015-06-20 DIAGNOSIS — M4854XD Collapsed vertebra, not elsewhere classified, thoracic region, subsequent encounter for fracture with routine healing: Secondary | ICD-10-CM | POA: Diagnosis not present

## 2015-06-20 DIAGNOSIS — J45909 Unspecified asthma, uncomplicated: Secondary | ICD-10-CM | POA: Diagnosis not present

## 2015-06-20 DIAGNOSIS — K8050 Calculus of bile duct without cholangitis or cholecystitis without obstruction: Secondary | ICD-10-CM | POA: Diagnosis not present

## 2015-06-20 DIAGNOSIS — K8042 Calculus of bile duct with acute cholecystitis without obstruction: Secondary | ICD-10-CM | POA: Diagnosis not present

## 2015-06-20 DIAGNOSIS — K811 Chronic cholecystitis: Secondary | ICD-10-CM | POA: Diagnosis not present

## 2015-06-20 DIAGNOSIS — K829 Disease of gallbladder, unspecified: Secondary | ICD-10-CM | POA: Diagnosis not present

## 2015-06-20 DIAGNOSIS — Z79899 Other long term (current) drug therapy: Secondary | ICD-10-CM | POA: Diagnosis not present

## 2015-06-20 DIAGNOSIS — E860 Dehydration: Secondary | ICD-10-CM | POA: Diagnosis not present

## 2015-06-20 DIAGNOSIS — Z7982 Long term (current) use of aspirin: Secondary | ICD-10-CM | POA: Diagnosis not present

## 2015-06-20 DIAGNOSIS — G8918 Other acute postprocedural pain: Secondary | ICD-10-CM | POA: Diagnosis not present

## 2015-06-20 DIAGNOSIS — E669 Obesity, unspecified: Secondary | ICD-10-CM | POA: Diagnosis not present

## 2015-06-20 DIAGNOSIS — K8011 Calculus of gallbladder with chronic cholecystitis with obstruction: Secondary | ICD-10-CM | POA: Diagnosis not present

## 2015-06-20 DIAGNOSIS — G9341 Metabolic encephalopathy: Secondary | ICD-10-CM | POA: Diagnosis not present

## 2015-06-20 DIAGNOSIS — Z882 Allergy status to sulfonamides status: Secondary | ICD-10-CM | POA: Diagnosis not present

## 2015-06-20 DIAGNOSIS — M069 Rheumatoid arthritis, unspecified: Secondary | ICD-10-CM | POA: Diagnosis not present

## 2015-06-20 DIAGNOSIS — M1990 Unspecified osteoarthritis, unspecified site: Secondary | ICD-10-CM | POA: Diagnosis not present

## 2015-06-20 DIAGNOSIS — Z48815 Encounter for surgical aftercare following surgery on the digestive system: Secondary | ICD-10-CM | POA: Diagnosis not present

## 2015-06-20 DIAGNOSIS — F418 Other specified anxiety disorders: Secondary | ICD-10-CM | POA: Diagnosis not present

## 2015-06-20 DIAGNOSIS — Z23 Encounter for immunization: Secondary | ICD-10-CM | POA: Diagnosis not present

## 2015-06-21 DIAGNOSIS — I4891 Unspecified atrial fibrillation: Secondary | ICD-10-CM | POA: Diagnosis not present

## 2015-06-23 DIAGNOSIS — K219 Gastro-esophageal reflux disease without esophagitis: Secondary | ICD-10-CM | POA: Diagnosis not present

## 2015-06-23 DIAGNOSIS — Z884 Allergy status to anesthetic agent status: Secondary | ICD-10-CM | POA: Diagnosis not present

## 2015-06-23 DIAGNOSIS — R262 Difficulty in walking, not elsewhere classified: Secondary | ICD-10-CM | POA: Diagnosis not present

## 2015-06-23 DIAGNOSIS — K8042 Calculus of bile duct with acute cholecystitis without obstruction: Secondary | ICD-10-CM | POA: Diagnosis not present

## 2015-06-23 DIAGNOSIS — Z7982 Long term (current) use of aspirin: Secondary | ICD-10-CM | POA: Diagnosis not present

## 2015-06-23 DIAGNOSIS — F418 Other specified anxiety disorders: Secondary | ICD-10-CM | POA: Diagnosis not present

## 2015-06-23 DIAGNOSIS — Z885 Allergy status to narcotic agent status: Secondary | ICD-10-CM | POA: Diagnosis not present

## 2015-06-23 DIAGNOSIS — K829 Disease of gallbladder, unspecified: Secondary | ICD-10-CM | POA: Diagnosis not present

## 2015-06-23 DIAGNOSIS — Z23 Encounter for immunization: Secondary | ICD-10-CM | POA: Diagnosis not present

## 2015-06-23 DIAGNOSIS — M4854XD Collapsed vertebra, not elsewhere classified, thoracic region, subsequent encounter for fracture with routine healing: Secondary | ICD-10-CM | POA: Diagnosis not present

## 2015-06-23 DIAGNOSIS — I4891 Unspecified atrial fibrillation: Secondary | ICD-10-CM | POA: Diagnosis not present

## 2015-06-23 DIAGNOSIS — D649 Anemia, unspecified: Secondary | ICD-10-CM | POA: Diagnosis not present

## 2015-06-23 DIAGNOSIS — J45909 Unspecified asthma, uncomplicated: Secondary | ICD-10-CM | POA: Diagnosis not present

## 2015-06-23 DIAGNOSIS — E669 Obesity, unspecified: Secondary | ICD-10-CM | POA: Diagnosis not present

## 2015-06-23 DIAGNOSIS — Z882 Allergy status to sulfonamides status: Secondary | ICD-10-CM | POA: Diagnosis not present

## 2015-06-23 DIAGNOSIS — Z79899 Other long term (current) drug therapy: Secondary | ICD-10-CM | POA: Diagnosis not present

## 2015-06-23 DIAGNOSIS — I480 Paroxysmal atrial fibrillation: Secondary | ICD-10-CM | POA: Diagnosis not present

## 2015-06-23 DIAGNOSIS — E860 Dehydration: Secondary | ICD-10-CM | POA: Diagnosis not present

## 2015-06-23 DIAGNOSIS — M1990 Unspecified osteoarthritis, unspecified site: Secondary | ICD-10-CM | POA: Diagnosis not present

## 2015-06-23 DIAGNOSIS — N390 Urinary tract infection, site not specified: Secondary | ICD-10-CM | POA: Diagnosis not present

## 2015-06-23 DIAGNOSIS — K449 Diaphragmatic hernia without obstruction or gangrene: Secondary | ICD-10-CM | POA: Diagnosis not present

## 2015-06-23 DIAGNOSIS — N3000 Acute cystitis without hematuria: Secondary | ICD-10-CM | POA: Diagnosis not present

## 2015-06-23 DIAGNOSIS — Z6834 Body mass index (BMI) 34.0-34.9, adult: Secondary | ICD-10-CM | POA: Diagnosis not present

## 2015-06-23 DIAGNOSIS — I10 Essential (primary) hypertension: Secondary | ICD-10-CM | POA: Diagnosis not present

## 2015-06-23 DIAGNOSIS — Z48815 Encounter for surgical aftercare following surgery on the digestive system: Secondary | ICD-10-CM | POA: Diagnosis not present

## 2015-06-23 DIAGNOSIS — G9341 Metabolic encephalopathy: Secondary | ICD-10-CM | POA: Diagnosis not present

## 2015-06-23 DIAGNOSIS — G8918 Other acute postprocedural pain: Secondary | ICD-10-CM | POA: Diagnosis not present

## 2015-06-23 DIAGNOSIS — R339 Retention of urine, unspecified: Secondary | ICD-10-CM | POA: Diagnosis not present

## 2015-06-25 DIAGNOSIS — D649 Anemia, unspecified: Secondary | ICD-10-CM | POA: Diagnosis not present

## 2015-06-25 DIAGNOSIS — R262 Difficulty in walking, not elsewhere classified: Secondary | ICD-10-CM | POA: Diagnosis not present

## 2015-06-25 DIAGNOSIS — I4891 Unspecified atrial fibrillation: Secondary | ICD-10-CM | POA: Diagnosis not present

## 2015-06-25 DIAGNOSIS — K219 Gastro-esophageal reflux disease without esophagitis: Secondary | ICD-10-CM | POA: Diagnosis not present

## 2015-07-07 DIAGNOSIS — N390 Urinary tract infection, site not specified: Secondary | ICD-10-CM | POA: Diagnosis not present

## 2015-07-15 DIAGNOSIS — R262 Difficulty in walking, not elsewhere classified: Secondary | ICD-10-CM | POA: Diagnosis not present

## 2015-07-15 DIAGNOSIS — M6281 Muscle weakness (generalized): Secondary | ICD-10-CM | POA: Diagnosis not present

## 2015-07-15 DIAGNOSIS — Z48815 Encounter for surgical aftercare following surgery on the digestive system: Secondary | ICD-10-CM | POA: Diagnosis not present

## 2015-07-15 DIAGNOSIS — D649 Anemia, unspecified: Secondary | ICD-10-CM | POA: Diagnosis not present

## 2015-07-15 DIAGNOSIS — I480 Paroxysmal atrial fibrillation: Secondary | ICD-10-CM | POA: Diagnosis not present

## 2015-07-15 DIAGNOSIS — I10 Essential (primary) hypertension: Secondary | ICD-10-CM | POA: Diagnosis not present

## 2015-07-17 DIAGNOSIS — I480 Paroxysmal atrial fibrillation: Secondary | ICD-10-CM | POA: Diagnosis not present

## 2015-07-17 DIAGNOSIS — D649 Anemia, unspecified: Secondary | ICD-10-CM | POA: Diagnosis not present

## 2015-07-17 DIAGNOSIS — Z48815 Encounter for surgical aftercare following surgery on the digestive system: Secondary | ICD-10-CM | POA: Diagnosis not present

## 2015-07-17 DIAGNOSIS — R262 Difficulty in walking, not elsewhere classified: Secondary | ICD-10-CM | POA: Diagnosis not present

## 2015-07-17 DIAGNOSIS — I10 Essential (primary) hypertension: Secondary | ICD-10-CM | POA: Diagnosis not present

## 2015-07-17 DIAGNOSIS — M6281 Muscle weakness (generalized): Secondary | ICD-10-CM | POA: Diagnosis not present

## 2015-07-18 DIAGNOSIS — M6281 Muscle weakness (generalized): Secondary | ICD-10-CM | POA: Diagnosis not present

## 2015-07-18 DIAGNOSIS — I480 Paroxysmal atrial fibrillation: Secondary | ICD-10-CM | POA: Diagnosis not present

## 2015-07-18 DIAGNOSIS — Z48815 Encounter for surgical aftercare following surgery on the digestive system: Secondary | ICD-10-CM | POA: Diagnosis not present

## 2015-07-18 DIAGNOSIS — R262 Difficulty in walking, not elsewhere classified: Secondary | ICD-10-CM | POA: Diagnosis not present

## 2015-07-18 DIAGNOSIS — I10 Essential (primary) hypertension: Secondary | ICD-10-CM | POA: Diagnosis not present

## 2015-07-18 DIAGNOSIS — D649 Anemia, unspecified: Secondary | ICD-10-CM | POA: Diagnosis not present

## 2015-07-19 DIAGNOSIS — M6281 Muscle weakness (generalized): Secondary | ICD-10-CM | POA: Diagnosis not present

## 2015-07-19 DIAGNOSIS — R262 Difficulty in walking, not elsewhere classified: Secondary | ICD-10-CM | POA: Diagnosis not present

## 2015-07-19 DIAGNOSIS — I480 Paroxysmal atrial fibrillation: Secondary | ICD-10-CM | POA: Diagnosis not present

## 2015-07-19 DIAGNOSIS — Z48815 Encounter for surgical aftercare following surgery on the digestive system: Secondary | ICD-10-CM | POA: Diagnosis not present

## 2015-07-19 DIAGNOSIS — D649 Anemia, unspecified: Secondary | ICD-10-CM | POA: Diagnosis not present

## 2015-07-19 DIAGNOSIS — I10 Essential (primary) hypertension: Secondary | ICD-10-CM | POA: Diagnosis not present

## 2015-07-20 DIAGNOSIS — D649 Anemia, unspecified: Secondary | ICD-10-CM | POA: Diagnosis not present

## 2015-07-20 DIAGNOSIS — I480 Paroxysmal atrial fibrillation: Secondary | ICD-10-CM | POA: Diagnosis not present

## 2015-07-20 DIAGNOSIS — R262 Difficulty in walking, not elsewhere classified: Secondary | ICD-10-CM | POA: Diagnosis not present

## 2015-07-20 DIAGNOSIS — Z48815 Encounter for surgical aftercare following surgery on the digestive system: Secondary | ICD-10-CM | POA: Diagnosis not present

## 2015-07-20 DIAGNOSIS — M6281 Muscle weakness (generalized): Secondary | ICD-10-CM | POA: Diagnosis not present

## 2015-07-20 DIAGNOSIS — I10 Essential (primary) hypertension: Secondary | ICD-10-CM | POA: Diagnosis not present

## 2015-07-21 ENCOUNTER — Other Ambulatory Visit: Payer: Self-pay | Admitting: Internal Medicine

## 2015-07-23 DIAGNOSIS — I10 Essential (primary) hypertension: Secondary | ICD-10-CM | POA: Diagnosis not present

## 2015-07-23 DIAGNOSIS — D649 Anemia, unspecified: Secondary | ICD-10-CM | POA: Diagnosis not present

## 2015-07-23 DIAGNOSIS — Z48815 Encounter for surgical aftercare following surgery on the digestive system: Secondary | ICD-10-CM | POA: Diagnosis not present

## 2015-07-23 DIAGNOSIS — I480 Paroxysmal atrial fibrillation: Secondary | ICD-10-CM | POA: Diagnosis not present

## 2015-07-23 DIAGNOSIS — R262 Difficulty in walking, not elsewhere classified: Secondary | ICD-10-CM | POA: Diagnosis not present

## 2015-07-23 DIAGNOSIS — M6281 Muscle weakness (generalized): Secondary | ICD-10-CM | POA: Diagnosis not present

## 2015-07-24 DIAGNOSIS — M6281 Muscle weakness (generalized): Secondary | ICD-10-CM | POA: Diagnosis not present

## 2015-07-24 DIAGNOSIS — I10 Essential (primary) hypertension: Secondary | ICD-10-CM | POA: Diagnosis not present

## 2015-07-24 DIAGNOSIS — Z48815 Encounter for surgical aftercare following surgery on the digestive system: Secondary | ICD-10-CM | POA: Diagnosis not present

## 2015-07-24 DIAGNOSIS — D649 Anemia, unspecified: Secondary | ICD-10-CM | POA: Diagnosis not present

## 2015-07-24 DIAGNOSIS — I480 Paroxysmal atrial fibrillation: Secondary | ICD-10-CM | POA: Diagnosis not present

## 2015-07-24 DIAGNOSIS — R262 Difficulty in walking, not elsewhere classified: Secondary | ICD-10-CM | POA: Diagnosis not present

## 2015-07-26 DIAGNOSIS — I10 Essential (primary) hypertension: Secondary | ICD-10-CM | POA: Diagnosis not present

## 2015-07-26 DIAGNOSIS — D649 Anemia, unspecified: Secondary | ICD-10-CM | POA: Diagnosis not present

## 2015-07-26 DIAGNOSIS — R262 Difficulty in walking, not elsewhere classified: Secondary | ICD-10-CM | POA: Diagnosis not present

## 2015-07-26 DIAGNOSIS — M6281 Muscle weakness (generalized): Secondary | ICD-10-CM | POA: Diagnosis not present

## 2015-07-26 DIAGNOSIS — Z48815 Encounter for surgical aftercare following surgery on the digestive system: Secondary | ICD-10-CM | POA: Diagnosis not present

## 2015-07-26 DIAGNOSIS — I480 Paroxysmal atrial fibrillation: Secondary | ICD-10-CM | POA: Diagnosis not present

## 2015-07-30 DIAGNOSIS — R262 Difficulty in walking, not elsewhere classified: Secondary | ICD-10-CM | POA: Diagnosis not present

## 2015-07-30 DIAGNOSIS — L0390 Cellulitis, unspecified: Secondary | ICD-10-CM | POA: Diagnosis not present

## 2015-07-30 DIAGNOSIS — I10 Essential (primary) hypertension: Secondary | ICD-10-CM | POA: Diagnosis not present

## 2015-07-30 DIAGNOSIS — I480 Paroxysmal atrial fibrillation: Secondary | ICD-10-CM | POA: Diagnosis not present

## 2015-07-30 DIAGNOSIS — M6281 Muscle weakness (generalized): Secondary | ICD-10-CM | POA: Diagnosis not present

## 2015-07-30 DIAGNOSIS — D649 Anemia, unspecified: Secondary | ICD-10-CM | POA: Diagnosis not present

## 2015-07-30 DIAGNOSIS — Z48815 Encounter for surgical aftercare following surgery on the digestive system: Secondary | ICD-10-CM | POA: Diagnosis not present

## 2015-07-31 DIAGNOSIS — F4489 Other dissociative and conversion disorders: Secondary | ICD-10-CM | POA: Diagnosis not present

## 2015-07-31 DIAGNOSIS — I480 Paroxysmal atrial fibrillation: Secondary | ICD-10-CM | POA: Diagnosis not present

## 2015-07-31 DIAGNOSIS — E780 Pure hypercholesterolemia: Secondary | ICD-10-CM | POA: Diagnosis not present

## 2015-07-31 DIAGNOSIS — D649 Anemia, unspecified: Secondary | ICD-10-CM | POA: Diagnosis not present

## 2015-07-31 DIAGNOSIS — L03115 Cellulitis of right lower limb: Secondary | ICD-10-CM | POA: Diagnosis not present

## 2015-07-31 DIAGNOSIS — R4182 Altered mental status, unspecified: Secondary | ICD-10-CM | POA: Diagnosis not present

## 2015-07-31 DIAGNOSIS — J449 Chronic obstructive pulmonary disease, unspecified: Secondary | ICD-10-CM | POA: Diagnosis not present

## 2015-07-31 DIAGNOSIS — F329 Major depressive disorder, single episode, unspecified: Secondary | ICD-10-CM | POA: Diagnosis not present

## 2015-07-31 DIAGNOSIS — I6789 Other cerebrovascular disease: Secondary | ICD-10-CM | POA: Diagnosis not present

## 2015-07-31 DIAGNOSIS — M6281 Muscle weakness (generalized): Secondary | ICD-10-CM | POA: Diagnosis not present

## 2015-07-31 DIAGNOSIS — R262 Difficulty in walking, not elsewhere classified: Secondary | ICD-10-CM | POA: Diagnosis not present

## 2015-07-31 DIAGNOSIS — N3000 Acute cystitis without hematuria: Secondary | ICD-10-CM | POA: Diagnosis not present

## 2015-07-31 DIAGNOSIS — Z452 Encounter for adjustment and management of vascular access device: Secondary | ICD-10-CM | POA: Diagnosis not present

## 2015-07-31 DIAGNOSIS — R110 Nausea: Secondary | ICD-10-CM | POA: Diagnosis not present

## 2015-07-31 DIAGNOSIS — I10 Essential (primary) hypertension: Secondary | ICD-10-CM | POA: Diagnosis not present

## 2015-07-31 DIAGNOSIS — Z48815 Encounter for surgical aftercare following surgery on the digestive system: Secondary | ICD-10-CM | POA: Diagnosis not present

## 2015-08-02 DIAGNOSIS — R262 Difficulty in walking, not elsewhere classified: Secondary | ICD-10-CM | POA: Diagnosis not present

## 2015-08-02 DIAGNOSIS — M6281 Muscle weakness (generalized): Secondary | ICD-10-CM | POA: Diagnosis not present

## 2015-08-02 DIAGNOSIS — Z48815 Encounter for surgical aftercare following surgery on the digestive system: Secondary | ICD-10-CM | POA: Diagnosis not present

## 2015-08-02 DIAGNOSIS — D649 Anemia, unspecified: Secondary | ICD-10-CM | POA: Diagnosis not present

## 2015-08-02 DIAGNOSIS — I480 Paroxysmal atrial fibrillation: Secondary | ICD-10-CM | POA: Diagnosis not present

## 2015-08-02 DIAGNOSIS — I10 Essential (primary) hypertension: Secondary | ICD-10-CM | POA: Diagnosis not present

## 2015-08-03 DIAGNOSIS — I10 Essential (primary) hypertension: Secondary | ICD-10-CM | POA: Diagnosis not present

## 2015-08-03 DIAGNOSIS — A480 Gas gangrene: Secondary | ICD-10-CM | POA: Diagnosis not present

## 2015-08-03 DIAGNOSIS — R600 Localized edema: Secondary | ICD-10-CM | POA: Diagnosis not present

## 2015-08-03 DIAGNOSIS — A047 Enterocolitis due to Clostridium difficile: Secondary | ICD-10-CM | POA: Diagnosis not present

## 2015-08-03 DIAGNOSIS — I480 Paroxysmal atrial fibrillation: Secondary | ICD-10-CM | POA: Diagnosis not present

## 2015-08-03 DIAGNOSIS — L03115 Cellulitis of right lower limb: Secondary | ICD-10-CM | POA: Diagnosis not present

## 2015-08-03 DIAGNOSIS — M6281 Muscle weakness (generalized): Secondary | ICD-10-CM | POA: Diagnosis not present

## 2015-08-03 DIAGNOSIS — M25461 Effusion, right knee: Secondary | ICD-10-CM | POA: Diagnosis not present

## 2015-08-03 DIAGNOSIS — N3000 Acute cystitis without hematuria: Secondary | ICD-10-CM | POA: Diagnosis not present

## 2015-08-03 DIAGNOSIS — E669 Obesity, unspecified: Secondary | ICD-10-CM | POA: Diagnosis not present

## 2015-08-03 DIAGNOSIS — I482 Chronic atrial fibrillation: Secondary | ICD-10-CM | POA: Diagnosis not present

## 2015-08-03 DIAGNOSIS — Z789 Other specified health status: Secondary | ICD-10-CM | POA: Diagnosis not present

## 2015-08-03 DIAGNOSIS — Z6839 Body mass index (BMI) 39.0-39.9, adult: Secondary | ICD-10-CM | POA: Diagnosis not present

## 2015-08-03 DIAGNOSIS — L02415 Cutaneous abscess of right lower limb: Secondary | ICD-10-CM | POA: Diagnosis not present

## 2015-08-03 DIAGNOSIS — A488 Other specified bacterial diseases: Secondary | ICD-10-CM | POA: Diagnosis not present

## 2015-08-03 DIAGNOSIS — Z79899 Other long term (current) drug therapy: Secondary | ICD-10-CM | POA: Diagnosis not present

## 2015-08-03 DIAGNOSIS — R410 Disorientation, unspecified: Secondary | ICD-10-CM | POA: Diagnosis not present

## 2015-08-03 DIAGNOSIS — D649 Anemia, unspecified: Secondary | ICD-10-CM | POA: Diagnosis not present

## 2015-08-03 DIAGNOSIS — Z7982 Long term (current) use of aspirin: Secondary | ICD-10-CM | POA: Diagnosis not present

## 2015-08-03 DIAGNOSIS — M1711 Unilateral primary osteoarthritis, right knee: Secondary | ICD-10-CM | POA: Diagnosis not present

## 2015-08-03 DIAGNOSIS — R262 Difficulty in walking, not elsewhere classified: Secondary | ICD-10-CM | POA: Diagnosis not present

## 2015-08-03 DIAGNOSIS — Z48815 Encounter for surgical aftercare following surgery on the digestive system: Secondary | ICD-10-CM | POA: Diagnosis not present

## 2015-08-03 DIAGNOSIS — R5381 Other malaise: Secondary | ICD-10-CM | POA: Diagnosis not present

## 2015-08-03 DIAGNOSIS — M726 Necrotizing fasciitis: Secondary | ICD-10-CM | POA: Diagnosis not present

## 2015-08-03 DIAGNOSIS — G9341 Metabolic encephalopathy: Secondary | ICD-10-CM | POA: Diagnosis not present

## 2015-08-03 DIAGNOSIS — K219 Gastro-esophageal reflux disease without esophagitis: Secondary | ICD-10-CM | POA: Diagnosis not present

## 2015-08-10 ENCOUNTER — Inpatient Hospital Stay
Admission: AD | Admit: 2015-08-10 | Discharge: 2015-09-04 | Disposition: A | Discharge status: Skilled Nursing Facility | Payer: Self-pay | Source: Ambulatory Visit | Attending: Internal Medicine | Admitting: Internal Medicine

## 2015-08-10 DIAGNOSIS — S79912A Unspecified injury of left hip, initial encounter: Secondary | ICD-10-CM | POA: Diagnosis not present

## 2015-08-10 DIAGNOSIS — I2510 Atherosclerotic heart disease of native coronary artery without angina pectoris: Secondary | ICD-10-CM | POA: Diagnosis not present

## 2015-08-10 DIAGNOSIS — N179 Acute kidney failure, unspecified: Secondary | ICD-10-CM | POA: Diagnosis not present

## 2015-08-10 DIAGNOSIS — T1490XA Injury, unspecified, initial encounter: Secondary | ICD-10-CM

## 2015-08-10 DIAGNOSIS — I10 Essential (primary) hypertension: Secondary | ICD-10-CM | POA: Diagnosis not present

## 2015-08-10 DIAGNOSIS — N390 Urinary tract infection, site not specified: Secondary | ICD-10-CM | POA: Diagnosis not present

## 2015-08-10 DIAGNOSIS — M25552 Pain in left hip: Secondary | ICD-10-CM | POA: Diagnosis not present

## 2015-08-10 DIAGNOSIS — F419 Anxiety disorder, unspecified: Secondary | ICD-10-CM | POA: Diagnosis not present

## 2015-08-10 DIAGNOSIS — G8929 Other chronic pain: Secondary | ICD-10-CM | POA: Diagnosis not present

## 2015-08-10 DIAGNOSIS — M6281 Muscle weakness (generalized): Secondary | ICD-10-CM | POA: Diagnosis not present

## 2015-08-10 DIAGNOSIS — Z48817 Encounter for surgical aftercare following surgery on the skin and subcutaneous tissue: Secondary | ICD-10-CM | POA: Diagnosis not present

## 2015-08-10 DIAGNOSIS — G8921 Chronic pain due to trauma: Secondary | ICD-10-CM | POA: Diagnosis not present

## 2015-08-10 DIAGNOSIS — G9340 Encephalopathy, unspecified: Secondary | ICD-10-CM | POA: Diagnosis not present

## 2015-08-10 DIAGNOSIS — I881 Chronic lymphadenitis, except mesenteric: Secondary | ICD-10-CM | POA: Diagnosis not present

## 2015-08-10 DIAGNOSIS — E440 Moderate protein-calorie malnutrition: Secondary | ICD-10-CM | POA: Diagnosis not present

## 2015-08-10 DIAGNOSIS — R262 Difficulty in walking, not elsewhere classified: Secondary | ICD-10-CM | POA: Diagnosis not present

## 2015-08-10 DIAGNOSIS — S81001A Unspecified open wound, right knee, initial encounter: Secondary | ICD-10-CM | POA: Diagnosis not present

## 2015-08-10 DIAGNOSIS — L02415 Cutaneous abscess of right lower limb: Secondary | ICD-10-CM | POA: Diagnosis not present

## 2015-08-10 DIAGNOSIS — M25531 Pain in right wrist: Secondary | ICD-10-CM | POA: Diagnosis not present

## 2015-08-10 DIAGNOSIS — R112 Nausea with vomiting, unspecified: Secondary | ICD-10-CM | POA: Diagnosis not present

## 2015-08-10 DIAGNOSIS — K219 Gastro-esophageal reflux disease without esophagitis: Secondary | ICD-10-CM | POA: Diagnosis not present

## 2015-08-10 DIAGNOSIS — S71101S Unspecified open wound, right thigh, sequela: Secondary | ICD-10-CM | POA: Diagnosis not present

## 2015-08-10 DIAGNOSIS — M1990 Unspecified osteoarthritis, unspecified site: Secondary | ICD-10-CM | POA: Diagnosis not present

## 2015-08-10 DIAGNOSIS — A498 Other bacterial infections of unspecified site: Secondary | ICD-10-CM | POA: Diagnosis not present

## 2015-08-10 DIAGNOSIS — B965 Pseudomonas (aeruginosa) (mallei) (pseudomallei) as the cause of diseases classified elsewhere: Secondary | ICD-10-CM | POA: Diagnosis not present

## 2015-08-10 DIAGNOSIS — I480 Paroxysmal atrial fibrillation: Secondary | ICD-10-CM | POA: Diagnosis not present

## 2015-08-10 DIAGNOSIS — S81801S Unspecified open wound, right lower leg, sequela: Secondary | ICD-10-CM | POA: Diagnosis not present

## 2015-08-10 DIAGNOSIS — Z6836 Body mass index (BMI) 36.0-36.9, adult: Secondary | ICD-10-CM | POA: Diagnosis not present

## 2015-08-10 DIAGNOSIS — A047 Enterocolitis due to Clostridium difficile: Secondary | ICD-10-CM | POA: Diagnosis not present

## 2015-08-10 DIAGNOSIS — F418 Other specified anxiety disorders: Secondary | ICD-10-CM | POA: Diagnosis not present

## 2015-08-10 DIAGNOSIS — R42 Dizziness and giddiness: Secondary | ICD-10-CM | POA: Diagnosis not present

## 2015-08-10 DIAGNOSIS — G894 Chronic pain syndrome: Secondary | ICD-10-CM | POA: Diagnosis not present

## 2015-08-10 DIAGNOSIS — R410 Disorientation, unspecified: Secondary | ICD-10-CM | POA: Diagnosis not present

## 2015-08-10 DIAGNOSIS — G4700 Insomnia, unspecified: Secondary | ICD-10-CM | POA: Diagnosis not present

## 2015-08-10 DIAGNOSIS — R531 Weakness: Secondary | ICD-10-CM | POA: Diagnosis not present

## 2015-08-10 DIAGNOSIS — I4891 Unspecified atrial fibrillation: Secondary | ICD-10-CM | POA: Diagnosis not present

## 2015-08-10 DIAGNOSIS — M19011 Primary osteoarthritis, right shoulder: Secondary | ICD-10-CM | POA: Diagnosis not present

## 2015-08-10 DIAGNOSIS — F1120 Opioid dependence, uncomplicated: Secondary | ICD-10-CM | POA: Diagnosis not present

## 2015-08-10 DIAGNOSIS — M726 Necrotizing fasciitis: Secondary | ICD-10-CM | POA: Diagnosis not present

## 2015-08-10 DIAGNOSIS — E871 Hypo-osmolality and hyponatremia: Secondary | ICD-10-CM | POA: Diagnosis not present

## 2015-08-10 DIAGNOSIS — B3749 Other urogenital candidiasis: Secondary | ICD-10-CM | POA: Diagnosis not present

## 2015-08-10 DIAGNOSIS — M79631 Pain in right forearm: Secondary | ICD-10-CM | POA: Diagnosis not present

## 2015-08-10 DIAGNOSIS — F329 Major depressive disorder, single episode, unspecified: Secondary | ICD-10-CM | POA: Diagnosis not present

## 2015-08-10 DIAGNOSIS — I890 Lymphedema, not elsewhere classified: Secondary | ICD-10-CM | POA: Diagnosis not present

## 2015-08-10 DIAGNOSIS — I639 Cerebral infarction, unspecified: Secondary | ICD-10-CM

## 2015-08-10 DIAGNOSIS — Z48815 Encounter for surgical aftercare following surgery on the digestive system: Secondary | ICD-10-CM | POA: Diagnosis not present

## 2015-08-10 DIAGNOSIS — R52 Pain, unspecified: Secondary | ICD-10-CM

## 2015-08-10 DIAGNOSIS — R2689 Other abnormalities of gait and mobility: Secondary | ICD-10-CM | POA: Diagnosis not present

## 2015-08-11 LAB — COMPREHENSIVE METABOLIC PANEL
ALT: 8 U/L — ABNORMAL LOW (ref 14–54)
AST: 16 U/L (ref 15–41)
Albumin: 1.8 g/dL — ABNORMAL LOW (ref 3.5–5.0)
Alkaline Phosphatase: 53 U/L (ref 38–126)
Anion gap: 8 (ref 5–15)
CHLORIDE: 100 mmol/L — AB (ref 101–111)
CO2: 27 mmol/L (ref 22–32)
Calcium: 7.6 mg/dL — ABNORMAL LOW (ref 8.9–10.3)
Creatinine, Ser: 0.6 mg/dL (ref 0.44–1.00)
Glucose, Bld: 81 mg/dL (ref 65–99)
POTASSIUM: 4.1 mmol/L (ref 3.5–5.1)
SODIUM: 135 mmol/L (ref 135–145)
Total Bilirubin: 0.5 mg/dL (ref 0.3–1.2)
Total Protein: 4.7 g/dL — ABNORMAL LOW (ref 6.5–8.1)

## 2015-08-11 LAB — CBC WITH DIFFERENTIAL/PLATELET
BASOS ABS: 0 10*3/uL (ref 0.0–0.1)
Basophils Relative: 0 % (ref 0–1)
EOS ABS: 0.1 10*3/uL (ref 0.0–0.7)
EOS PCT: 1 % (ref 0–5)
HCT: 24.7 % — ABNORMAL LOW (ref 36.0–46.0)
Hemoglobin: 8.1 g/dL — ABNORMAL LOW (ref 12.0–15.0)
LYMPHS PCT: 24 % (ref 12–46)
Lymphs Abs: 1.4 10*3/uL (ref 0.7–4.0)
MCH: 30 pg (ref 26.0–34.0)
MCHC: 32.8 g/dL (ref 30.0–36.0)
MCV: 91.5 fL (ref 78.0–100.0)
MONO ABS: 0.6 10*3/uL (ref 0.1–1.0)
Monocytes Relative: 11 % (ref 3–12)
Neutro Abs: 3.8 10*3/uL (ref 1.7–7.7)
Neutrophils Relative %: 64 % (ref 43–77)
PLATELETS: 339 10*3/uL (ref 150–400)
RBC: 2.7 MIL/uL — ABNORMAL LOW (ref 3.87–5.11)
RDW: 16 % — AB (ref 11.5–15.5)
WBC: 5.8 10*3/uL (ref 4.0–10.5)

## 2015-08-11 LAB — MAGNESIUM: MAGNESIUM: 1.5 mg/dL — AB (ref 1.7–2.4)

## 2015-08-11 LAB — PHOSPHORUS: PHOSPHORUS: 4.1 mg/dL (ref 2.5–4.6)

## 2015-08-12 LAB — URINALYSIS, ROUTINE W REFLEX MICROSCOPIC
BILIRUBIN URINE: NEGATIVE
Glucose, UA: NEGATIVE mg/dL
HGB URINE DIPSTICK: NEGATIVE
Ketones, ur: 15 mg/dL — AB
Nitrite: NEGATIVE
PH: 7.5 (ref 5.0–8.0)
Protein, ur: NEGATIVE mg/dL
SPECIFIC GRAVITY, URINE: 1.016 (ref 1.005–1.030)
Urobilinogen, UA: 0.2 mg/dL (ref 0.0–1.0)

## 2015-08-12 LAB — URINE MICROSCOPIC-ADD ON

## 2015-08-13 ENCOUNTER — Other Ambulatory Visit (HOSPITAL_COMMUNITY): Payer: Self-pay

## 2015-08-13 DIAGNOSIS — S79912A Unspecified injury of left hip, initial encounter: Secondary | ICD-10-CM | POA: Diagnosis not present

## 2015-08-13 DIAGNOSIS — M25552 Pain in left hip: Secondary | ICD-10-CM | POA: Diagnosis not present

## 2015-08-13 LAB — URINE CULTURE: CULTURE: NO GROWTH

## 2015-08-14 LAB — CBC
HCT: 29.1 % — ABNORMAL LOW (ref 36.0–46.0)
HEMOGLOBIN: 9.3 g/dL — AB (ref 12.0–15.0)
MCH: 29.2 pg (ref 26.0–34.0)
MCHC: 32 g/dL (ref 30.0–36.0)
MCV: 91.5 fL (ref 78.0–100.0)
Platelets: 338 10*3/uL (ref 150–400)
RBC: 3.18 MIL/uL — AB (ref 3.87–5.11)
RDW: 16 % — ABNORMAL HIGH (ref 11.5–15.5)
WBC: 8.7 10*3/uL (ref 4.0–10.5)

## 2015-08-14 LAB — BASIC METABOLIC PANEL
Anion gap: 6 (ref 5–15)
CHLORIDE: 95 mmol/L — AB (ref 101–111)
CO2: 28 mmol/L (ref 22–32)
Calcium: 8.2 mg/dL — ABNORMAL LOW (ref 8.9–10.3)
Creatinine, Ser: 0.76 mg/dL (ref 0.44–1.00)
GFR calc Af Amer: >60 mL/min (ref >60)
GFR calc non Af Amer: >60 mL/min (ref >60)
GLUCOSE: 87 mg/dL (ref 65–99)
POTASSIUM: 4.2 mmol/L (ref 3.5–5.1)
SODIUM: 129 mmol/L — AB (ref 135–145)

## 2015-08-14 LAB — MAGNESIUM: MAGNESIUM: 1.9 mg/dL (ref 1.7–2.4)

## 2015-08-15 LAB — BASIC METABOLIC PANEL
ANION GAP: 5 (ref 5–15)
BUN: 11 mg/dL (ref 6–20)
CO2: 28 mmol/L (ref 22–32)
Calcium: 8 mg/dL — ABNORMAL LOW (ref 8.9–10.3)
Chloride: 98 mmol/L — ABNORMAL LOW (ref 101–111)
Creatinine, Ser: 0.91 mg/dL (ref 0.44–1.00)
GFR calc Af Amer: >60 mL/min (ref >60)
GLUCOSE: 83 mg/dL (ref 65–99)
POTASSIUM: 4.1 mmol/L (ref 3.5–5.1)
Sodium: 131 mmol/L — ABNORMAL LOW (ref 135–145)

## 2015-08-17 DIAGNOSIS — Z48815 Encounter for surgical aftercare following surgery on the digestive system: Secondary | ICD-10-CM | POA: Diagnosis not present

## 2015-08-17 DIAGNOSIS — R262 Difficulty in walking, not elsewhere classified: Secondary | ICD-10-CM | POA: Diagnosis not present

## 2015-08-17 LAB — URINALYSIS, ROUTINE W REFLEX MICROSCOPIC
Glucose, UA: NEGATIVE mg/dL
Hgb urine dipstick: NEGATIVE
Ketones, ur: 15 mg/dL — AB
NITRITE: NEGATIVE
PH: 5 (ref 5.0–8.0)
Protein, ur: 30 mg/dL — AB
SPECIFIC GRAVITY, URINE: 1.024 (ref 1.005–1.030)
UROBILINOGEN UA: 0.2 mg/dL (ref 0.0–1.0)

## 2015-08-17 LAB — URINE MICROSCOPIC-ADD ON

## 2015-08-18 LAB — URINE CULTURE: Special Requests: NORMAL

## 2015-08-20 LAB — BASIC METABOLIC PANEL
Anion gap: 7 (ref 5–15)
Anion gap: 7 (ref 5–15)
BUN: 32 mg/dL — ABNORMAL HIGH (ref 6–20)
BUN: 32 mg/dL — ABNORMAL HIGH (ref 6–20)
CALCIUM: 8.6 mg/dL — AB (ref 8.9–10.3)
CALCIUM: 8.5 mg/dL — AB (ref 8.9–10.3)
CO2: 29 mmol/L (ref 22–32)
CO2: 30 mmol/L (ref 22–32)
CREATININE: 1.84 mg/dL — AB (ref 0.44–1.00)
CREATININE: 1.73 mg/dL — AB (ref 0.44–1.00)
Chloride: 98 mmol/L — ABNORMAL LOW (ref 101–111)
Chloride: 98 mmol/L — ABNORMAL LOW (ref 101–111)
GFR calc non Af Amer: 29 mL/min — ABNORMAL LOW (ref >60)
GFR, EST AFRICAN AMERICAN: 31 mL/min — AB (ref >60)
GFR, EST AFRICAN AMERICAN: 33 mL/min — AB (ref >60)
GFR, EST NON AFRICAN AMERICAN: 27 mL/min — AB (ref >60)
Glucose, Bld: 101 mg/dL — ABNORMAL HIGH (ref 65–99)
Glucose, Bld: 131 mg/dL — ABNORMAL HIGH (ref 65–99)
Potassium: 5.3 mmol/L — ABNORMAL HIGH (ref 3.5–5.1)
Potassium: 4.8 mmol/L (ref 3.5–5.1)
SODIUM: 134 mmol/L — AB (ref 135–145)
SODIUM: 135 mmol/L (ref 135–145)

## 2015-08-20 LAB — CBC WITH DIFFERENTIAL/PLATELET
BASOS PCT: 1 % (ref 0–1)
Basophils Absolute: 0 10*3/uL (ref 0.0–0.1)
EOS ABS: 0.1 10*3/uL (ref 0.0–0.7)
EOS PCT: 2 % (ref 0–5)
HCT: 30.4 % — ABNORMAL LOW (ref 36.0–46.0)
Hemoglobin: 9.7 g/dL — ABNORMAL LOW (ref 12.0–15.0)
Lymphocytes Relative: 25 % (ref 12–46)
Lymphs Abs: 1.7 10*3/uL (ref 0.7–4.0)
MCH: 29.8 pg (ref 26.0–34.0)
MCHC: 31.9 g/dL (ref 30.0–36.0)
MCV: 93.3 fL (ref 78.0–100.0)
MONO ABS: 0.7 10*3/uL (ref 0.1–1.0)
MONOS PCT: 10 % (ref 3–12)
Neutro Abs: 4.1 10*3/uL (ref 1.7–7.7)
Neutrophils Relative %: 62 % (ref 43–77)
PLATELETS: 233 10*3/uL (ref 150–400)
RBC: 3.26 MIL/uL — ABNORMAL LOW (ref 3.87–5.11)
RDW: 15.5 % (ref 11.5–15.5)
WBC: 6.6 10*3/uL (ref 4.0–10.5)

## 2015-08-20 LAB — PHOSPHORUS: Phosphorus: 5 mg/dL — ABNORMAL HIGH (ref 2.5–4.6)

## 2015-08-21 LAB — BASIC METABOLIC PANEL
ANION GAP: 7 (ref 5–15)
BUN: 34 mg/dL — ABNORMAL HIGH (ref 6–20)
CALCIUM: 8.7 mg/dL — AB (ref 8.9–10.3)
CO2: 32 mmol/L (ref 22–32)
CREATININE: 1.79 mg/dL — AB (ref 0.44–1.00)
Chloride: 98 mmol/L — ABNORMAL LOW (ref 101–111)
GFR, EST AFRICAN AMERICAN: 32 mL/min — AB (ref >60)
GFR, EST NON AFRICAN AMERICAN: 28 mL/min — AB (ref >60)
Glucose, Bld: 83 mg/dL (ref 65–99)
Potassium: 4.9 mmol/L (ref 3.5–5.1)
SODIUM: 137 mmol/L (ref 135–145)

## 2015-08-21 LAB — MAGNESIUM: MAGNESIUM: 2.2 mg/dL (ref 1.7–2.4)

## 2015-08-21 LAB — PHOSPHORUS: PHOSPHORUS: 5.5 mg/dL — AB (ref 2.5–4.6)

## 2015-08-22 LAB — URINALYSIS, ROUTINE W REFLEX MICROSCOPIC
BILIRUBIN URINE: NEGATIVE
GLUCOSE, UA: NEGATIVE mg/dL
Ketones, ur: NEGATIVE mg/dL
Nitrite: POSITIVE — AB
Protein, ur: 30 mg/dL — AB
SPECIFIC GRAVITY, URINE: 1.015 (ref 1.005–1.030)
Urobilinogen, UA: 0.2 mg/dL (ref 0.0–1.0)
pH: 7.5 (ref 5.0–8.0)

## 2015-08-22 LAB — BASIC METABOLIC PANEL
Anion gap: 9 (ref 5–15)
BUN: 39 mg/dL — AB (ref 6–20)
CALCIUM: 8.7 mg/dL — AB (ref 8.9–10.3)
CHLORIDE: 95 mmol/L — AB (ref 101–111)
CO2: 31 mmol/L (ref 22–32)
CREATININE: 1.56 mg/dL — AB (ref 0.44–1.00)
GFR calc Af Amer: 38 mL/min — ABNORMAL LOW (ref >60)
GFR calc non Af Amer: 33 mL/min — ABNORMAL LOW (ref >60)
Glucose, Bld: 95 mg/dL (ref 65–99)
Potassium: 4.8 mmol/L (ref 3.5–5.1)
SODIUM: 135 mmol/L (ref 135–145)

## 2015-08-22 LAB — URINE MICROSCOPIC-ADD ON

## 2015-08-23 ENCOUNTER — Other Ambulatory Visit (HOSPITAL_COMMUNITY): Payer: Self-pay

## 2015-08-23 DIAGNOSIS — M79631 Pain in right forearm: Secondary | ICD-10-CM | POA: Diagnosis not present

## 2015-08-23 DIAGNOSIS — M25531 Pain in right wrist: Secondary | ICD-10-CM | POA: Diagnosis not present

## 2015-08-23 DIAGNOSIS — M19011 Primary osteoarthritis, right shoulder: Secondary | ICD-10-CM | POA: Diagnosis not present

## 2015-08-23 LAB — BASIC METABOLIC PANEL
Anion gap: 9 (ref 5–15)
BUN: 37 mg/dL — AB (ref 6–20)
CHLORIDE: 96 mmol/L — AB (ref 101–111)
CO2: 31 mmol/L (ref 22–32)
CREATININE: 1.47 mg/dL — AB (ref 0.44–1.00)
Calcium: 8.7 mg/dL — ABNORMAL LOW (ref 8.9–10.3)
GFR calc non Af Amer: 35 mL/min — ABNORMAL LOW (ref >60)
GFR, EST AFRICAN AMERICAN: 41 mL/min — AB (ref >60)
Glucose, Bld: 101 mg/dL — ABNORMAL HIGH (ref 65–99)
Potassium: 4.2 mmol/L (ref 3.5–5.1)
Sodium: 136 mmol/L (ref 135–145)

## 2015-08-23 LAB — CBC WITH DIFFERENTIAL/PLATELET
Basophils Absolute: 0 10*3/uL (ref 0.0–0.1)
Basophils Relative: 0 % (ref 0–1)
Eosinophils Absolute: 0.1 10*3/uL (ref 0.0–0.7)
Eosinophils Relative: 1 % (ref 0–5)
HEMATOCRIT: 29 % — AB (ref 36.0–46.0)
HEMOGLOBIN: 9.3 g/dL — AB (ref 12.0–15.0)
LYMPHS ABS: 2.2 10*3/uL (ref 0.7–4.0)
Lymphocytes Relative: 23 % (ref 12–46)
MCH: 29.2 pg (ref 26.0–34.0)
MCHC: 32.1 g/dL (ref 30.0–36.0)
MCV: 90.9 fL (ref 78.0–100.0)
MONOS PCT: 14 % — AB (ref 3–12)
Monocytes Absolute: 1.3 10*3/uL — ABNORMAL HIGH (ref 0.1–1.0)
NEUTROS ABS: 6 10*3/uL (ref 1.7–7.7)
NEUTROS PCT: 62 % (ref 43–77)
Platelets: 299 10*3/uL (ref 150–400)
RBC: 3.19 MIL/uL — ABNORMAL LOW (ref 3.87–5.11)
RDW: 15.4 % (ref 11.5–15.5)
WBC: 9.7 10*3/uL (ref 4.0–10.5)

## 2015-08-24 LAB — URINALYSIS, ROUTINE W REFLEX MICROSCOPIC
Bilirubin Urine: NEGATIVE
Bilirubin Urine: NEGATIVE
GLUCOSE, UA: NEGATIVE mg/dL
Glucose, UA: NEGATIVE mg/dL
KETONES UR: NEGATIVE mg/dL
KETONES UR: NEGATIVE mg/dL
Nitrite: NEGATIVE
Nitrite: POSITIVE — AB
PH: 5.5 (ref 5.0–8.0)
PROTEIN: NEGATIVE mg/dL
Protein, ur: 30 mg/dL — AB
SPECIFIC GRAVITY, URINE: 1.019 (ref 1.005–1.030)
Specific Gravity, Urine: 1.01 (ref 1.005–1.030)
UROBILINOGEN UA: 0.2 mg/dL (ref 0.0–1.0)
Urobilinogen, UA: 0.2 mg/dL (ref 0.0–1.0)
pH: 6 (ref 5.0–8.0)

## 2015-08-24 LAB — URINE MICROSCOPIC-ADD ON

## 2015-08-24 LAB — URINE CULTURE

## 2015-08-25 LAB — RENAL FUNCTION PANEL
ALBUMIN: 2.3 g/dL — AB (ref 3.5–5.0)
Anion gap: 8 (ref 5–15)
BUN: 39 mg/dL — AB (ref 6–20)
CO2: 30 mmol/L (ref 22–32)
Calcium: 8.9 mg/dL (ref 8.9–10.3)
Chloride: 99 mmol/L — ABNORMAL LOW (ref 101–111)
Creatinine, Ser: 1.39 mg/dL — ABNORMAL HIGH (ref 0.44–1.00)
GFR calc Af Amer: 43 mL/min — ABNORMAL LOW (ref >60)
GFR calc non Af Amer: 37 mL/min — ABNORMAL LOW (ref >60)
GLUCOSE: 88 mg/dL (ref 65–99)
PHOSPHORUS: 5.2 mg/dL — AB (ref 2.5–4.6)
POTASSIUM: 3.9 mmol/L (ref 3.5–5.1)
Sodium: 137 mmol/L (ref 135–145)

## 2015-08-26 LAB — C DIFFICILE QUICK SCREEN W PCR REFLEX
C DIFFICILE (CDIFF) TOXIN: NEGATIVE
C DIFFICLE (CDIFF) ANTIGEN: NEGATIVE
C Diff interpretation: NEGATIVE

## 2015-08-26 LAB — URINE CULTURE: SPECIAL REQUESTS: NORMAL

## 2015-08-27 LAB — CBC WITH DIFFERENTIAL/PLATELET
Basophils Absolute: 0 10*3/uL (ref 0.0–0.1)
Basophils Relative: 0 % (ref 0–1)
EOS PCT: 3 % (ref 0–5)
Eosinophils Absolute: 0.3 10*3/uL (ref 0.0–0.7)
HCT: 28.9 % — ABNORMAL LOW (ref 36.0–46.0)
Hemoglobin: 9.1 g/dL — ABNORMAL LOW (ref 12.0–15.0)
LYMPHS ABS: 2.1 10*3/uL (ref 0.7–4.0)
LYMPHS PCT: 26 % (ref 12–46)
MCH: 28.6 pg (ref 26.0–34.0)
MCHC: 31.5 g/dL (ref 30.0–36.0)
MCV: 90.9 fL (ref 78.0–100.0)
MONO ABS: 0.8 10*3/uL (ref 0.1–1.0)
Monocytes Relative: 10 % (ref 3–12)
Neutro Abs: 5 10*3/uL (ref 1.7–7.7)
Neutrophils Relative %: 61 % (ref 43–77)
PLATELETS: 349 10*3/uL (ref 150–400)
RBC: 3.18 MIL/uL — AB (ref 3.87–5.11)
RDW: 14.8 % (ref 11.5–15.5)
WBC: 8.1 10*3/uL (ref 4.0–10.5)

## 2015-08-27 LAB — BASIC METABOLIC PANEL
Anion gap: 10 (ref 5–15)
BUN: 27 mg/dL — AB (ref 6–20)
CHLORIDE: 101 mmol/L (ref 101–111)
CO2: 26 mmol/L (ref 22–32)
Calcium: 8.7 mg/dL — ABNORMAL LOW (ref 8.9–10.3)
Creatinine, Ser: 1.16 mg/dL — ABNORMAL HIGH (ref 0.44–1.00)
GFR calc Af Amer: 54 mL/min — ABNORMAL LOW (ref >60)
GFR, EST NON AFRICAN AMERICAN: 47 mL/min — AB (ref >60)
GLUCOSE: 84 mg/dL (ref 65–99)
POTASSIUM: 3.6 mmol/L (ref 3.5–5.1)
Sodium: 137 mmol/L (ref 135–145)

## 2015-08-28 ENCOUNTER — Other Ambulatory Visit (HOSPITAL_COMMUNITY): Payer: Medicare Other

## 2015-08-28 DIAGNOSIS — R410 Disorientation, unspecified: Secondary | ICD-10-CM | POA: Diagnosis not present

## 2015-08-31 LAB — CBC WITH DIFFERENTIAL/PLATELET
Basophils Absolute: 0 10*3/uL (ref 0.0–0.1)
Basophils Relative: 0 % (ref 0–1)
Eosinophils Absolute: 0.3 10*3/uL (ref 0.0–0.7)
Eosinophils Relative: 3 % (ref 0–5)
HEMATOCRIT: 29.2 % — AB (ref 36.0–46.0)
Hemoglobin: 9.5 g/dL — ABNORMAL LOW (ref 12.0–15.0)
LYMPHS PCT: 27 % (ref 12–46)
Lymphs Abs: 2.5 10*3/uL (ref 0.7–4.0)
MCH: 29.5 pg (ref 26.0–34.0)
MCHC: 32.5 g/dL (ref 30.0–36.0)
MCV: 90.7 fL (ref 78.0–100.0)
Monocytes Absolute: 0.9 10*3/uL (ref 0.1–1.0)
Monocytes Relative: 10 % (ref 3–12)
NEUTROS ABS: 5.4 10*3/uL (ref 1.7–7.7)
NEUTROS PCT: 60 % (ref 43–77)
PLATELETS: 288 10*3/uL (ref 150–400)
RBC: 3.22 MIL/uL — AB (ref 3.87–5.11)
RDW: 15.1 % (ref 11.5–15.5)
WBC: 9.1 10*3/uL (ref 4.0–10.5)

## 2015-08-31 LAB — MAGNESIUM: MAGNESIUM: 1.8 mg/dL (ref 1.7–2.4)

## 2015-08-31 LAB — RENAL FUNCTION PANEL
ALBUMIN: 2.2 g/dL — AB (ref 3.5–5.0)
Anion gap: 7 (ref 5–15)
BUN: 37 mg/dL — AB (ref 6–20)
CHLORIDE: 109 mmol/L (ref 101–111)
CO2: 25 mmol/L (ref 22–32)
CREATININE: 1.48 mg/dL — AB (ref 0.44–1.00)
Calcium: 9.1 mg/dL (ref 8.9–10.3)
GFR calc Af Amer: 40 mL/min — ABNORMAL LOW (ref >60)
GFR, EST NON AFRICAN AMERICAN: 35 mL/min — AB (ref >60)
GLUCOSE: 95 mg/dL (ref 65–99)
PHOSPHORUS: 4.2 mg/dL (ref 2.5–4.6)
POTASSIUM: 3.3 mmol/L — AB (ref 3.5–5.1)
Sodium: 141 mmol/L (ref 135–145)

## 2015-08-31 LAB — PHOSPHORUS: Phosphorus: 4.1 mg/dL (ref 2.5–4.6)

## 2015-09-01 LAB — POTASSIUM: POTASSIUM: 3.2 mmol/L — AB (ref 3.5–5.1)

## 2015-09-02 LAB — URINALYSIS, ROUTINE W REFLEX MICROSCOPIC
BILIRUBIN URINE: NEGATIVE
GLUCOSE, UA: NEGATIVE mg/dL
Ketones, ur: 15 mg/dL — AB
Nitrite: NEGATIVE
PROTEIN: 30 mg/dL — AB
Specific Gravity, Urine: 1.016 (ref 1.005–1.030)
UROBILINOGEN UA: 0.2 mg/dL (ref 0.0–1.0)
pH: 5 (ref 5.0–8.0)

## 2015-09-02 LAB — URINE MICROSCOPIC-ADD ON

## 2015-09-02 LAB — POTASSIUM: POTASSIUM: 4 mmol/L (ref 3.5–5.1)

## 2015-09-03 LAB — CBC WITH DIFFERENTIAL/PLATELET
BASOS ABS: 0 10*3/uL (ref 0.0–0.1)
BASOS PCT: 0 % (ref 0–1)
Eosinophils Absolute: 0.2 10*3/uL (ref 0.0–0.7)
Eosinophils Relative: 2 % (ref 0–5)
HEMATOCRIT: 32.7 % — AB (ref 36.0–46.0)
Hemoglobin: 10.1 g/dL — ABNORMAL LOW (ref 12.0–15.0)
Lymphocytes Relative: 30 % (ref 12–46)
Lymphs Abs: 2.4 10*3/uL (ref 0.7–4.0)
MCH: 28.2 pg (ref 26.0–34.0)
MCHC: 30.9 g/dL (ref 30.0–36.0)
MCV: 91.3 fL (ref 78.0–100.0)
MONO ABS: 0.7 10*3/uL (ref 0.1–1.0)
Monocytes Relative: 9 % (ref 3–12)
NEUTROS ABS: 4.7 10*3/uL (ref 1.7–7.7)
Neutrophils Relative %: 59 % (ref 43–77)
PLATELETS: 305 10*3/uL (ref 150–400)
RBC: 3.58 MIL/uL — ABNORMAL LOW (ref 3.87–5.11)
RDW: 15.8 % — AB (ref 11.5–15.5)
WBC: 8.1 10*3/uL (ref 4.0–10.5)

## 2015-09-03 LAB — PHOSPHORUS: PHOSPHORUS: 4.6 mg/dL (ref 2.5–4.6)

## 2015-09-03 LAB — BASIC METABOLIC PANEL
ANION GAP: 9 (ref 5–15)
BUN: 42 mg/dL — ABNORMAL HIGH (ref 6–20)
CALCIUM: 9.1 mg/dL (ref 8.9–10.3)
CO2: 23 mmol/L (ref 22–32)
Chloride: 106 mmol/L (ref 101–111)
Creatinine, Ser: 1.87 mg/dL — ABNORMAL HIGH (ref 0.44–1.00)
GFR, EST AFRICAN AMERICAN: 30 mL/min — AB (ref >60)
GFR, EST NON AFRICAN AMERICAN: 26 mL/min — AB (ref >60)
Glucose, Bld: 89 mg/dL (ref 65–99)
Potassium: 3.9 mmol/L (ref 3.5–5.1)
Sodium: 138 mmol/L (ref 135–145)

## 2015-09-03 LAB — URINE CULTURE: SPECIAL REQUESTS: NORMAL

## 2015-09-03 LAB — MAGNESIUM: MAGNESIUM: 1.9 mg/dL (ref 1.7–2.4)

## 2015-09-04 DIAGNOSIS — F419 Anxiety disorder, unspecified: Secondary | ICD-10-CM | POA: Diagnosis not present

## 2015-09-04 DIAGNOSIS — I110 Hypertensive heart disease with heart failure: Secondary | ICD-10-CM | POA: Diagnosis not present

## 2015-09-04 DIAGNOSIS — Z9911 Dependence on respirator [ventilator] status: Secondary | ICD-10-CM | POA: Diagnosis not present

## 2015-09-04 DIAGNOSIS — J9600 Acute respiratory failure, unspecified whether with hypoxia or hypercapnia: Secondary | ICD-10-CM | POA: Diagnosis not present

## 2015-09-04 DIAGNOSIS — I4891 Unspecified atrial fibrillation: Secondary | ICD-10-CM | POA: Diagnosis not present

## 2015-09-04 DIAGNOSIS — E872 Acidosis: Secondary | ICD-10-CM | POA: Diagnosis not present

## 2015-09-04 DIAGNOSIS — J9690 Respiratory failure, unspecified, unspecified whether with hypoxia or hypercapnia: Secondary | ICD-10-CM | POA: Diagnosis not present

## 2015-09-04 DIAGNOSIS — K219 Gastro-esophageal reflux disease without esophagitis: Secondary | ICD-10-CM | POA: Diagnosis not present

## 2015-09-04 DIAGNOSIS — Z4682 Encounter for fitting and adjustment of non-vascular catheter: Secondary | ICD-10-CM | POA: Diagnosis not present

## 2015-09-04 DIAGNOSIS — R069 Unspecified abnormalities of breathing: Secondary | ICD-10-CM | POA: Diagnosis not present

## 2015-09-04 DIAGNOSIS — Z743 Need for continuous supervision: Secondary | ICD-10-CM | POA: Diagnosis not present

## 2015-09-04 DIAGNOSIS — I10 Essential (primary) hypertension: Secondary | ICD-10-CM | POA: Diagnosis not present

## 2015-09-04 DIAGNOSIS — I2510 Atherosclerotic heart disease of native coronary artery without angina pectoris: Secondary | ICD-10-CM | POA: Diagnosis not present

## 2015-09-04 DIAGNOSIS — A419 Sepsis, unspecified organism: Secondary | ICD-10-CM | POA: Diagnosis not present

## 2015-09-04 DIAGNOSIS — G4700 Insomnia, unspecified: Secondary | ICD-10-CM | POA: Diagnosis not present

## 2015-09-04 DIAGNOSIS — J45909 Unspecified asthma, uncomplicated: Secondary | ICD-10-CM | POA: Diagnosis not present

## 2015-09-04 DIAGNOSIS — Z79899 Other long term (current) drug therapy: Secondary | ICD-10-CM | POA: Diagnosis not present

## 2015-09-04 DIAGNOSIS — J9601 Acute respiratory failure with hypoxia: Secondary | ICD-10-CM | POA: Diagnosis not present

## 2015-09-04 DIAGNOSIS — E876 Hypokalemia: Secondary | ICD-10-CM | POA: Diagnosis not present

## 2015-09-04 DIAGNOSIS — R531 Weakness: Secondary | ICD-10-CM | POA: Diagnosis not present

## 2015-09-04 DIAGNOSIS — R6521 Severe sepsis with septic shock: Secondary | ICD-10-CM | POA: Diagnosis not present

## 2015-09-04 DIAGNOSIS — I881 Chronic lymphadenitis, except mesenteric: Secondary | ICD-10-CM | POA: Diagnosis not present

## 2015-09-04 DIAGNOSIS — R4182 Altered mental status, unspecified: Secondary | ICD-10-CM | POA: Diagnosis not present

## 2015-09-04 DIAGNOSIS — R2689 Other abnormalities of gait and mobility: Secondary | ICD-10-CM | POA: Diagnosis not present

## 2015-09-04 DIAGNOSIS — N19 Unspecified kidney failure: Secondary | ICD-10-CM | POA: Diagnosis not present

## 2015-09-04 DIAGNOSIS — E870 Hyperosmolality and hypernatremia: Secondary | ICD-10-CM | POA: Diagnosis not present

## 2015-09-04 DIAGNOSIS — M726 Necrotizing fasciitis: Secondary | ICD-10-CM | POA: Diagnosis not present

## 2015-09-04 DIAGNOSIS — S31000D Unspecified open wound of lower back and pelvis without penetration into retroperitoneum, subsequent encounter: Secondary | ICD-10-CM | POA: Diagnosis not present

## 2015-09-04 DIAGNOSIS — N390 Urinary tract infection, site not specified: Secondary | ICD-10-CM | POA: Diagnosis not present

## 2015-09-04 DIAGNOSIS — S71101S Unspecified open wound, right thigh, sequela: Secondary | ICD-10-CM | POA: Diagnosis not present

## 2015-09-04 DIAGNOSIS — N179 Acute kidney failure, unspecified: Secondary | ICD-10-CM | POA: Diagnosis not present

## 2015-09-04 DIAGNOSIS — A047 Enterocolitis due to Clostridium difficile: Secondary | ICD-10-CM | POA: Diagnosis not present

## 2015-09-04 DIAGNOSIS — E440 Moderate protein-calorie malnutrition: Secondary | ICD-10-CM | POA: Diagnosis not present

## 2015-09-04 DIAGNOSIS — R579 Shock, unspecified: Secondary | ICD-10-CM | POA: Diagnosis not present

## 2015-09-04 DIAGNOSIS — J189 Pneumonia, unspecified organism: Secondary | ICD-10-CM | POA: Diagnosis not present

## 2015-09-04 DIAGNOSIS — G8929 Other chronic pain: Secondary | ICD-10-CM | POA: Diagnosis not present

## 2015-09-04 DIAGNOSIS — E860 Dehydration: Secondary | ICD-10-CM | POA: Diagnosis not present

## 2015-09-04 DIAGNOSIS — R0682 Tachypnea, not elsewhere classified: Secondary | ICD-10-CM | POA: Diagnosis not present

## 2015-09-04 DIAGNOSIS — F329 Major depressive disorder, single episode, unspecified: Secondary | ICD-10-CM | POA: Diagnosis not present

## 2015-09-04 DIAGNOSIS — I890 Lymphedema, not elsewhere classified: Secondary | ICD-10-CM | POA: Diagnosis not present

## 2015-09-04 DIAGNOSIS — Z48817 Encounter for surgical aftercare following surgery on the skin and subcutaneous tissue: Secondary | ICD-10-CM | POA: Diagnosis not present

## 2015-09-04 DIAGNOSIS — M6281 Muscle weakness (generalized): Secondary | ICD-10-CM | POA: Diagnosis not present

## 2015-09-04 DIAGNOSIS — R410 Disorientation, unspecified: Secondary | ICD-10-CM | POA: Diagnosis not present

## 2015-09-04 DIAGNOSIS — A498 Other bacterial infections of unspecified site: Secondary | ICD-10-CM | POA: Diagnosis not present

## 2015-09-04 DIAGNOSIS — B9789 Other viral agents as the cause of diseases classified elsewhere: Secondary | ICD-10-CM | POA: Diagnosis not present

## 2015-09-04 DIAGNOSIS — A09 Infectious gastroenteritis and colitis, unspecified: Secondary | ICD-10-CM | POA: Diagnosis not present

## 2015-09-05 DIAGNOSIS — M726 Necrotizing fasciitis: Secondary | ICD-10-CM | POA: Diagnosis not present

## 2015-09-05 DIAGNOSIS — I890 Lymphedema, not elsewhere classified: Secondary | ICD-10-CM | POA: Diagnosis not present

## 2015-09-05 DIAGNOSIS — I110 Hypertensive heart disease with heart failure: Secondary | ICD-10-CM | POA: Diagnosis not present

## 2015-09-08 DIAGNOSIS — A047 Enterocolitis due to Clostridium difficile: Secondary | ICD-10-CM | POA: Diagnosis not present

## 2015-09-10 ENCOUNTER — Inpatient Hospital Stay
Admission: AD | Admit: 2015-09-10 | Payer: Self-pay | Source: Other Acute Inpatient Hospital | Admitting: Pulmonary Disease

## 2015-09-10 DIAGNOSIS — Z515 Encounter for palliative care: Secondary | ICD-10-CM | POA: Diagnosis not present

## 2015-09-10 DIAGNOSIS — E872 Acidosis: Secondary | ICD-10-CM | POA: Diagnosis not present

## 2015-09-10 DIAGNOSIS — I10 Essential (primary) hypertension: Secondary | ICD-10-CM | POA: Diagnosis not present

## 2015-09-10 DIAGNOSIS — I2510 Atherosclerotic heart disease of native coronary artery without angina pectoris: Secondary | ICD-10-CM | POA: Diagnosis not present

## 2015-09-10 DIAGNOSIS — A419 Sepsis, unspecified organism: Secondary | ICD-10-CM | POA: Diagnosis not present

## 2015-09-10 DIAGNOSIS — R069 Unspecified abnormalities of breathing: Secondary | ICD-10-CM | POA: Diagnosis not present

## 2015-09-10 DIAGNOSIS — F419 Anxiety disorder, unspecified: Secondary | ICD-10-CM | POA: Diagnosis not present

## 2015-09-10 DIAGNOSIS — R000 Tachycardia, unspecified: Secondary | ICD-10-CM | POA: Diagnosis not present

## 2015-09-10 DIAGNOSIS — Z743 Need for continuous supervision: Secondary | ICD-10-CM | POA: Diagnosis not present

## 2015-09-10 DIAGNOSIS — R6521 Severe sepsis with septic shock: Secondary | ICD-10-CM | POA: Diagnosis not present

## 2015-09-10 DIAGNOSIS — R0609 Other forms of dyspnea: Secondary | ICD-10-CM | POA: Diagnosis not present

## 2015-09-10 DIAGNOSIS — R579 Shock, unspecified: Secondary | ICD-10-CM | POA: Diagnosis not present

## 2015-09-10 DIAGNOSIS — J9600 Acute respiratory failure, unspecified whether with hypoxia or hypercapnia: Secondary | ICD-10-CM | POA: Diagnosis not present

## 2015-09-10 DIAGNOSIS — S71101A Unspecified open wound, right thigh, initial encounter: Secondary | ICD-10-CM | POA: Diagnosis not present

## 2015-09-10 DIAGNOSIS — A09 Infectious gastroenteritis and colitis, unspecified: Secondary | ICD-10-CM | POA: Diagnosis not present

## 2015-09-10 DIAGNOSIS — R4182 Altered mental status, unspecified: Secondary | ICD-10-CM | POA: Diagnosis not present

## 2015-09-10 DIAGNOSIS — J9690 Respiratory failure, unspecified, unspecified whether with hypoxia or hypercapnia: Secondary | ICD-10-CM | POA: Diagnosis not present

## 2015-09-10 DIAGNOSIS — Z79899 Other long term (current) drug therapy: Secondary | ICD-10-CM | POA: Diagnosis not present

## 2015-09-10 DIAGNOSIS — B9789 Other viral agents as the cause of diseases classified elsewhere: Secondary | ICD-10-CM | POA: Diagnosis not present

## 2015-09-10 DIAGNOSIS — R0682 Tachypnea, not elsewhere classified: Secondary | ICD-10-CM | POA: Diagnosis not present

## 2015-09-10 DIAGNOSIS — J45909 Unspecified asthma, uncomplicated: Secondary | ICD-10-CM | POA: Diagnosis not present

## 2015-09-10 DIAGNOSIS — J189 Pneumonia, unspecified organism: Secondary | ICD-10-CM | POA: Diagnosis not present

## 2015-09-10 DIAGNOSIS — Z4682 Encounter for fitting and adjustment of non-vascular catheter: Secondary | ICD-10-CM | POA: Diagnosis not present

## 2015-09-10 DIAGNOSIS — R410 Disorientation, unspecified: Secondary | ICD-10-CM | POA: Diagnosis not present

## 2015-09-10 DIAGNOSIS — F329 Major depressive disorder, single episode, unspecified: Secondary | ICD-10-CM | POA: Diagnosis not present

## 2015-09-10 DIAGNOSIS — S31000D Unspecified open wound of lower back and pelvis without penetration into retroperitoneum, subsequent encounter: Secondary | ICD-10-CM | POA: Diagnosis not present

## 2015-09-10 DIAGNOSIS — K219 Gastro-esophageal reflux disease without esophagitis: Secondary | ICD-10-CM | POA: Diagnosis not present

## 2015-09-10 DIAGNOSIS — N179 Acute kidney failure, unspecified: Secondary | ICD-10-CM | POA: Diagnosis not present

## 2015-09-10 DIAGNOSIS — N390 Urinary tract infection, site not specified: Secondary | ICD-10-CM | POA: Diagnosis not present

## 2015-09-10 DIAGNOSIS — E876 Hypokalemia: Secondary | ICD-10-CM | POA: Diagnosis not present

## 2015-09-10 DIAGNOSIS — J90 Pleural effusion, not elsewhere classified: Secondary | ICD-10-CM | POA: Diagnosis not present

## 2015-09-10 DIAGNOSIS — Z9911 Dependence on respirator [ventilator] status: Secondary | ICD-10-CM | POA: Diagnosis not present

## 2015-09-10 DIAGNOSIS — R6520 Severe sepsis without septic shock: Secondary | ICD-10-CM | POA: Diagnosis not present

## 2015-09-10 DIAGNOSIS — J9601 Acute respiratory failure with hypoxia: Secondary | ICD-10-CM | POA: Diagnosis not present

## 2015-09-10 DIAGNOSIS — I4891 Unspecified atrial fibrillation: Secondary | ICD-10-CM | POA: Diagnosis not present

## 2015-09-10 DIAGNOSIS — E860 Dehydration: Secondary | ICD-10-CM | POA: Diagnosis not present

## 2015-09-10 DIAGNOSIS — N19 Unspecified kidney failure: Secondary | ICD-10-CM | POA: Diagnosis not present

## 2015-09-10 DIAGNOSIS — E870 Hyperosmolality and hypernatremia: Secondary | ICD-10-CM | POA: Diagnosis not present

## 2015-09-10 DIAGNOSIS — R52 Pain, unspecified: Secondary | ICD-10-CM | POA: Diagnosis not present

## 2015-09-22 DEATH — deceased

## 2015-12-07 IMAGING — CR DG WRIST COMPLETE 3+V*R*
4 series · 4 of 4 positions shown · non-contrast
Comparison: None.

CLINICAL DATA: Pain in the right wrist

EXAM:
RIGHT WRIST - COMPLETE 3+ VIEW

[PA]
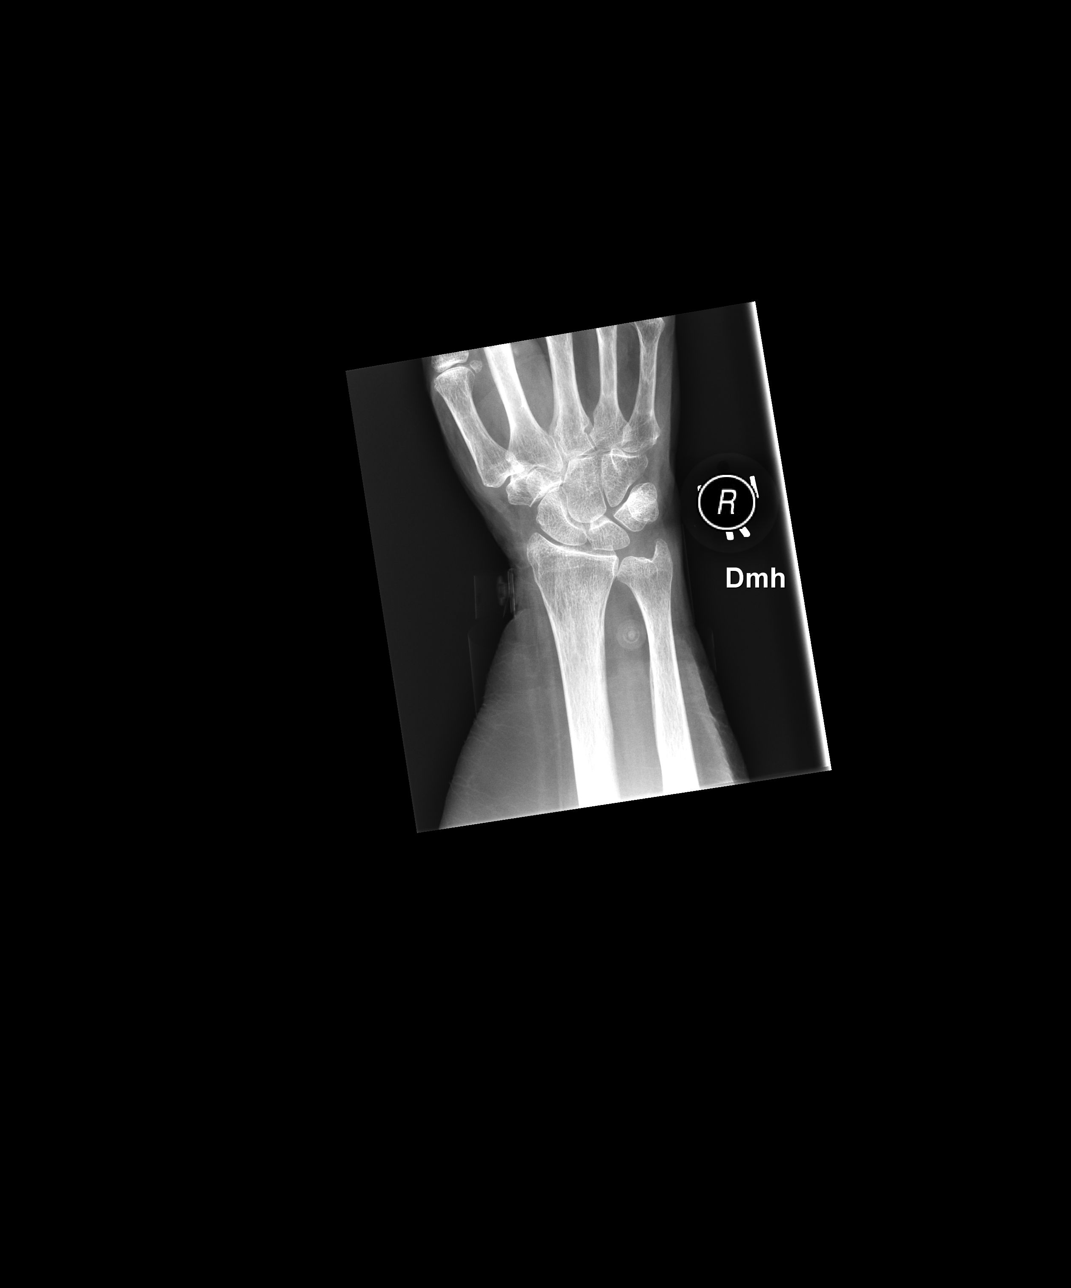

[lateral]
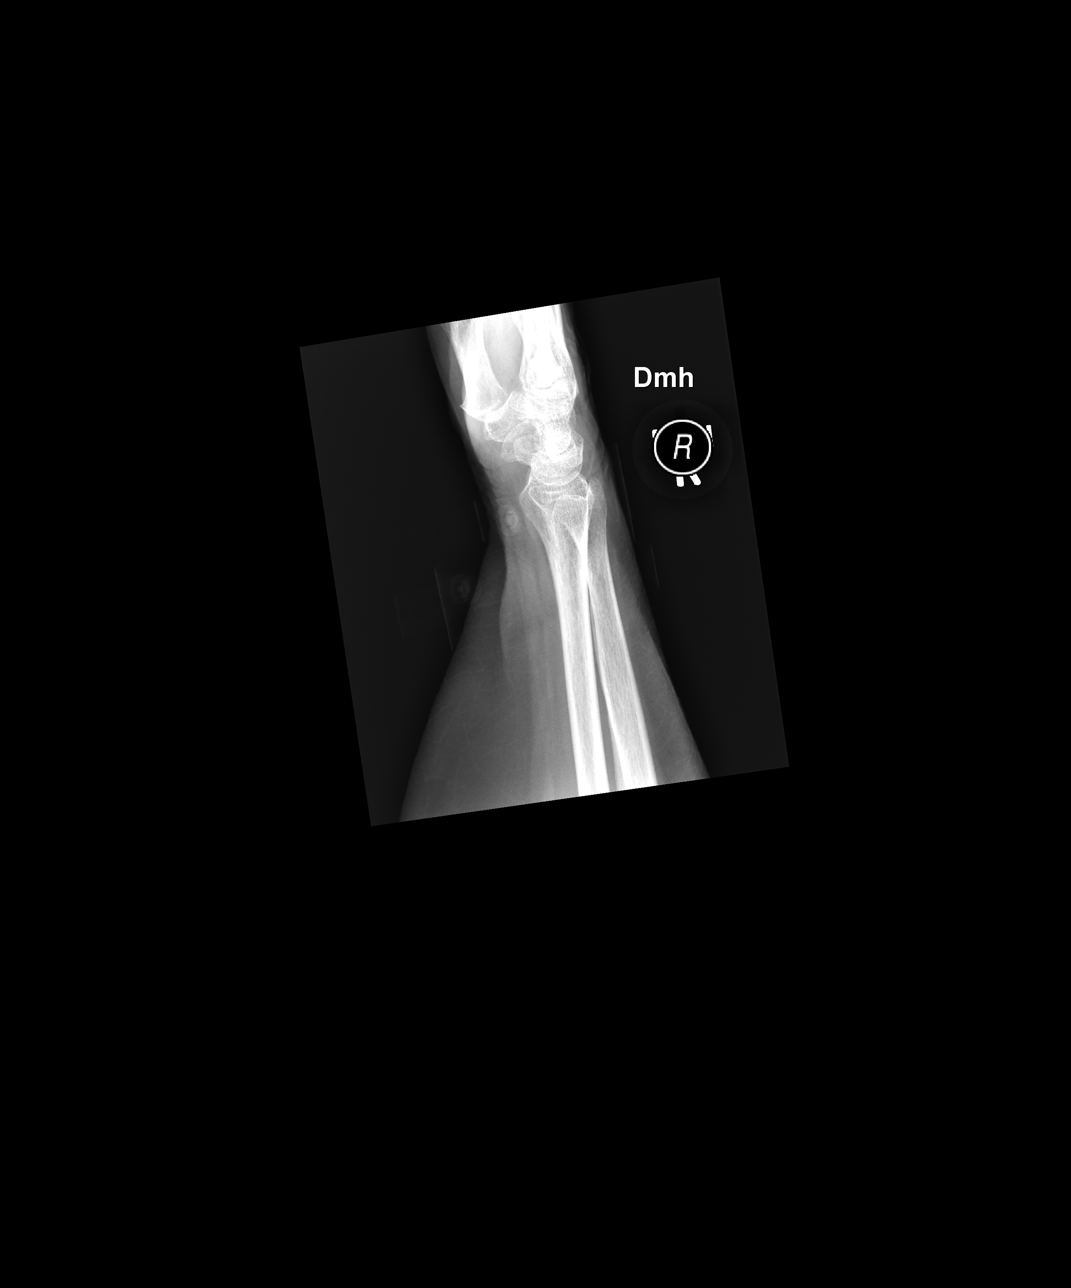

[pa navicular]
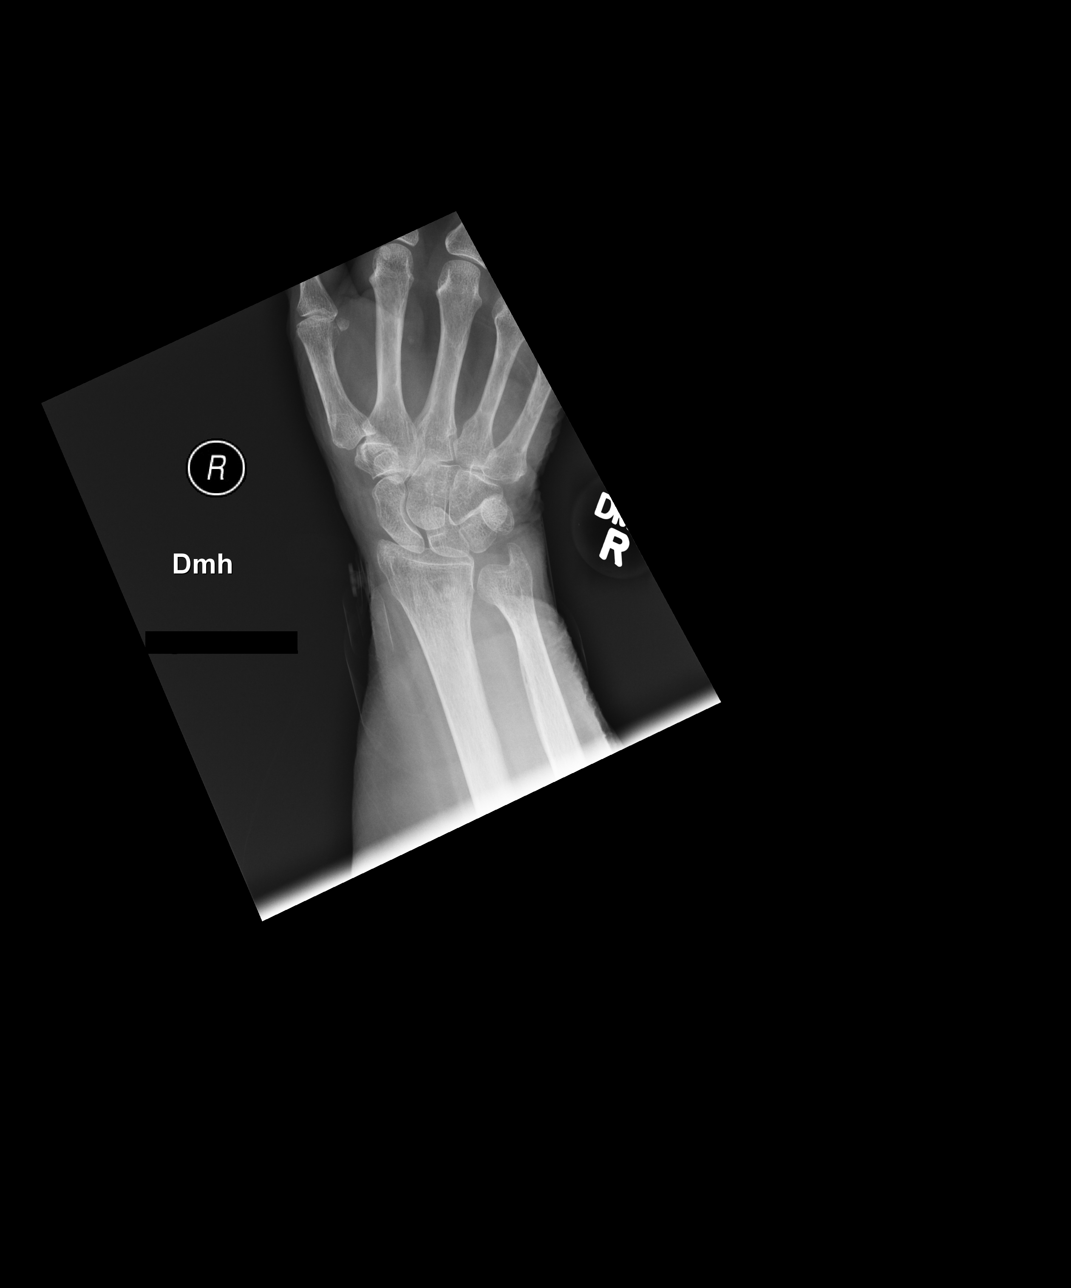

[ap ext rot]
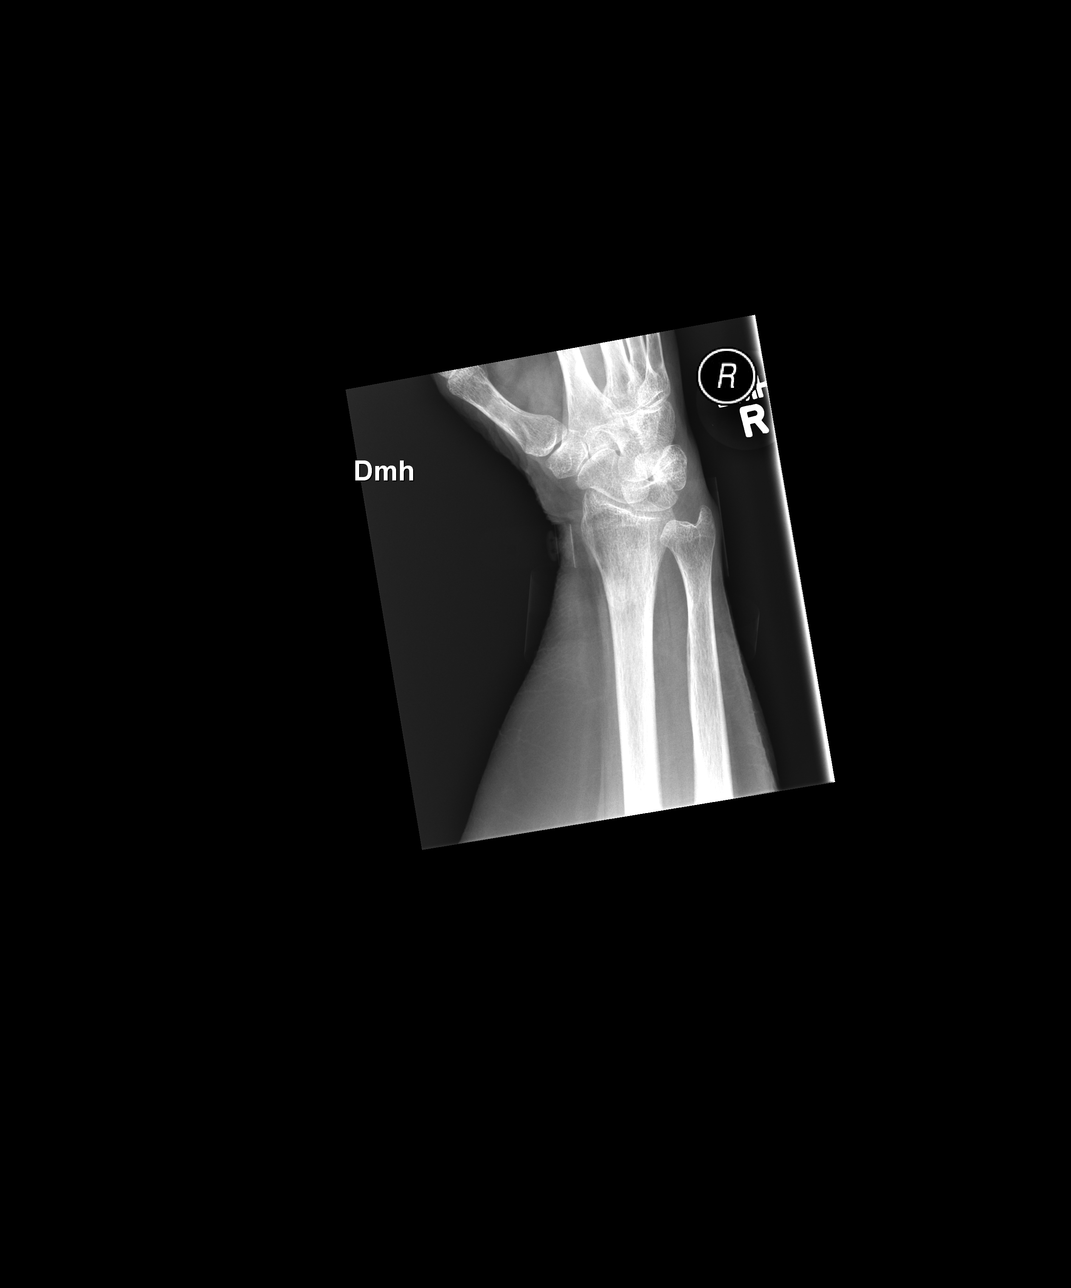

[4 of 4 positions shown; findings below may reference images not displayed]

FINDINGS: There is no evidence of fracture or dislocation. There is no
evidence of arthropathy or other focal bone abnormality. Soft
tissues are unremarkable.
IMPRESSION: Negative.

## 2015-12-07 IMAGING — CR DG FOREARM 2V*R*
2 series · 2 of 2 positions shown · non-contrast
Comparison: None.

CLINICAL DATA: Patient complains of right forearm pain after having
been moved by her nurse earlier today. Initial encounter.

EXAM:
Portable RIGHT FOREARM - 2 VIEW

[AP]
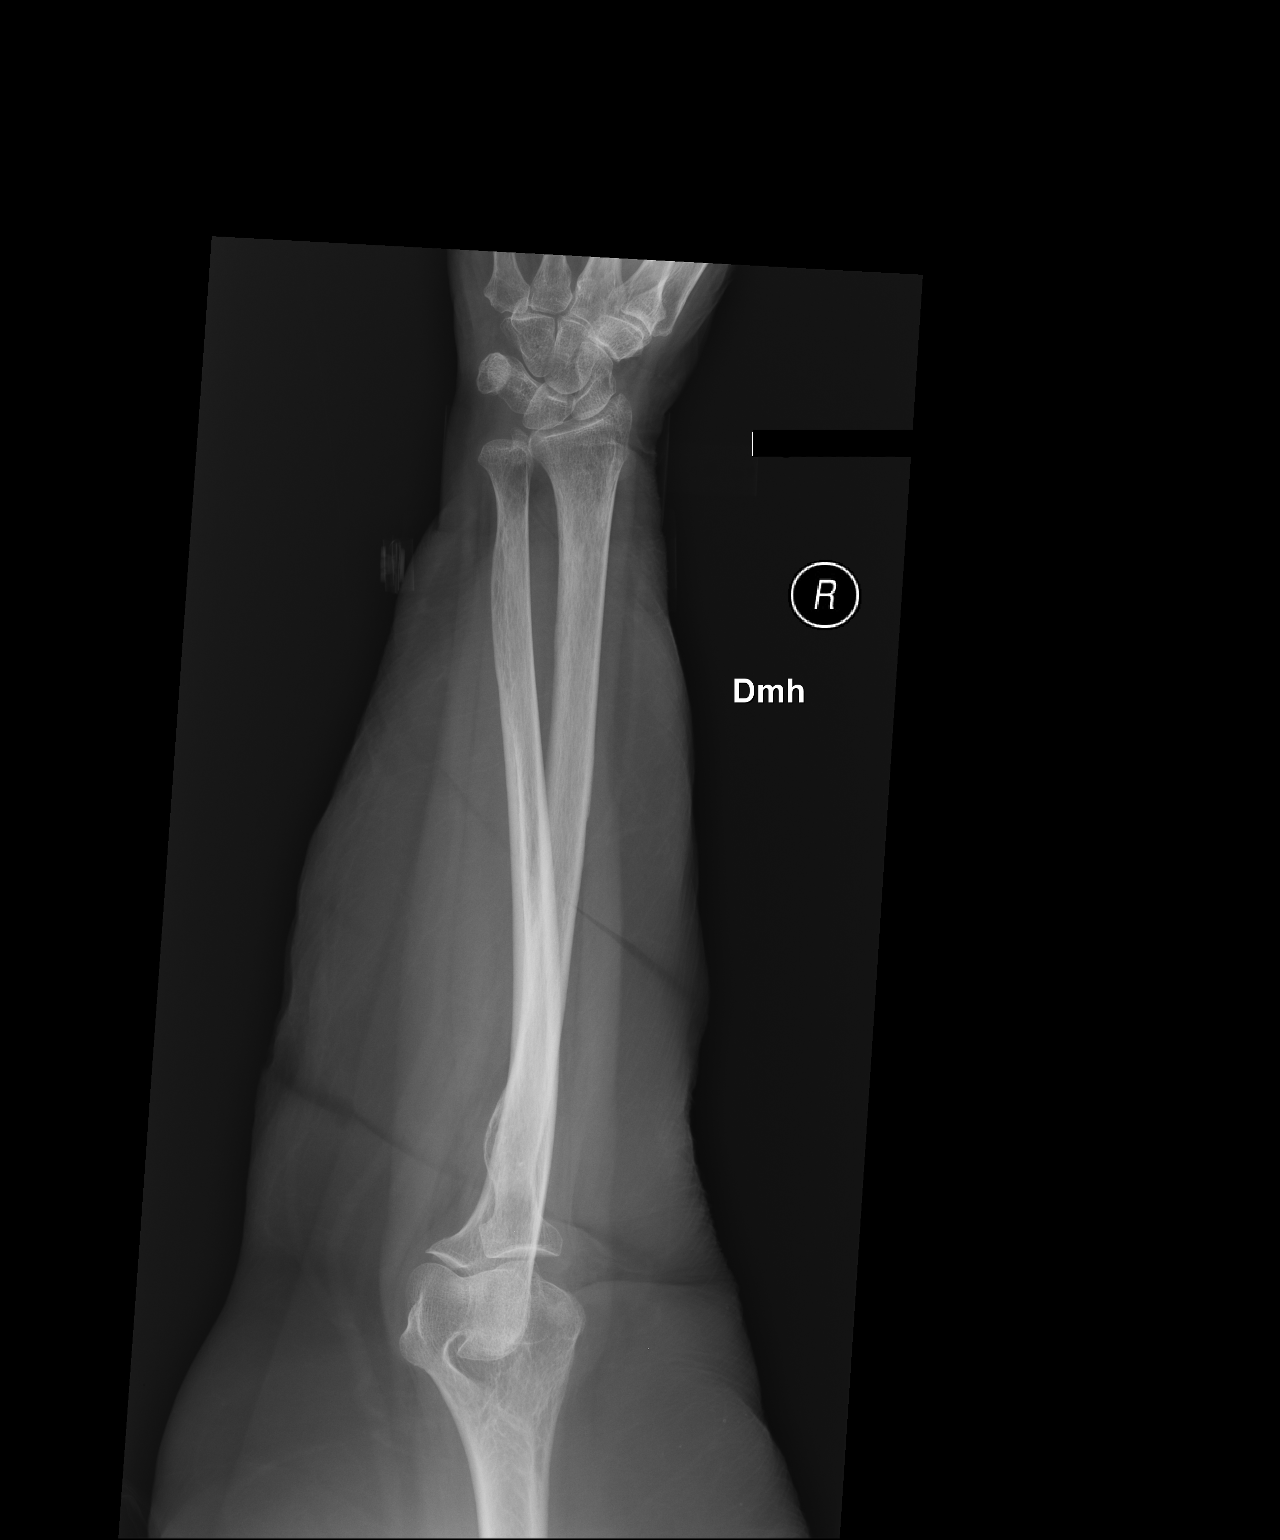

[lateral]
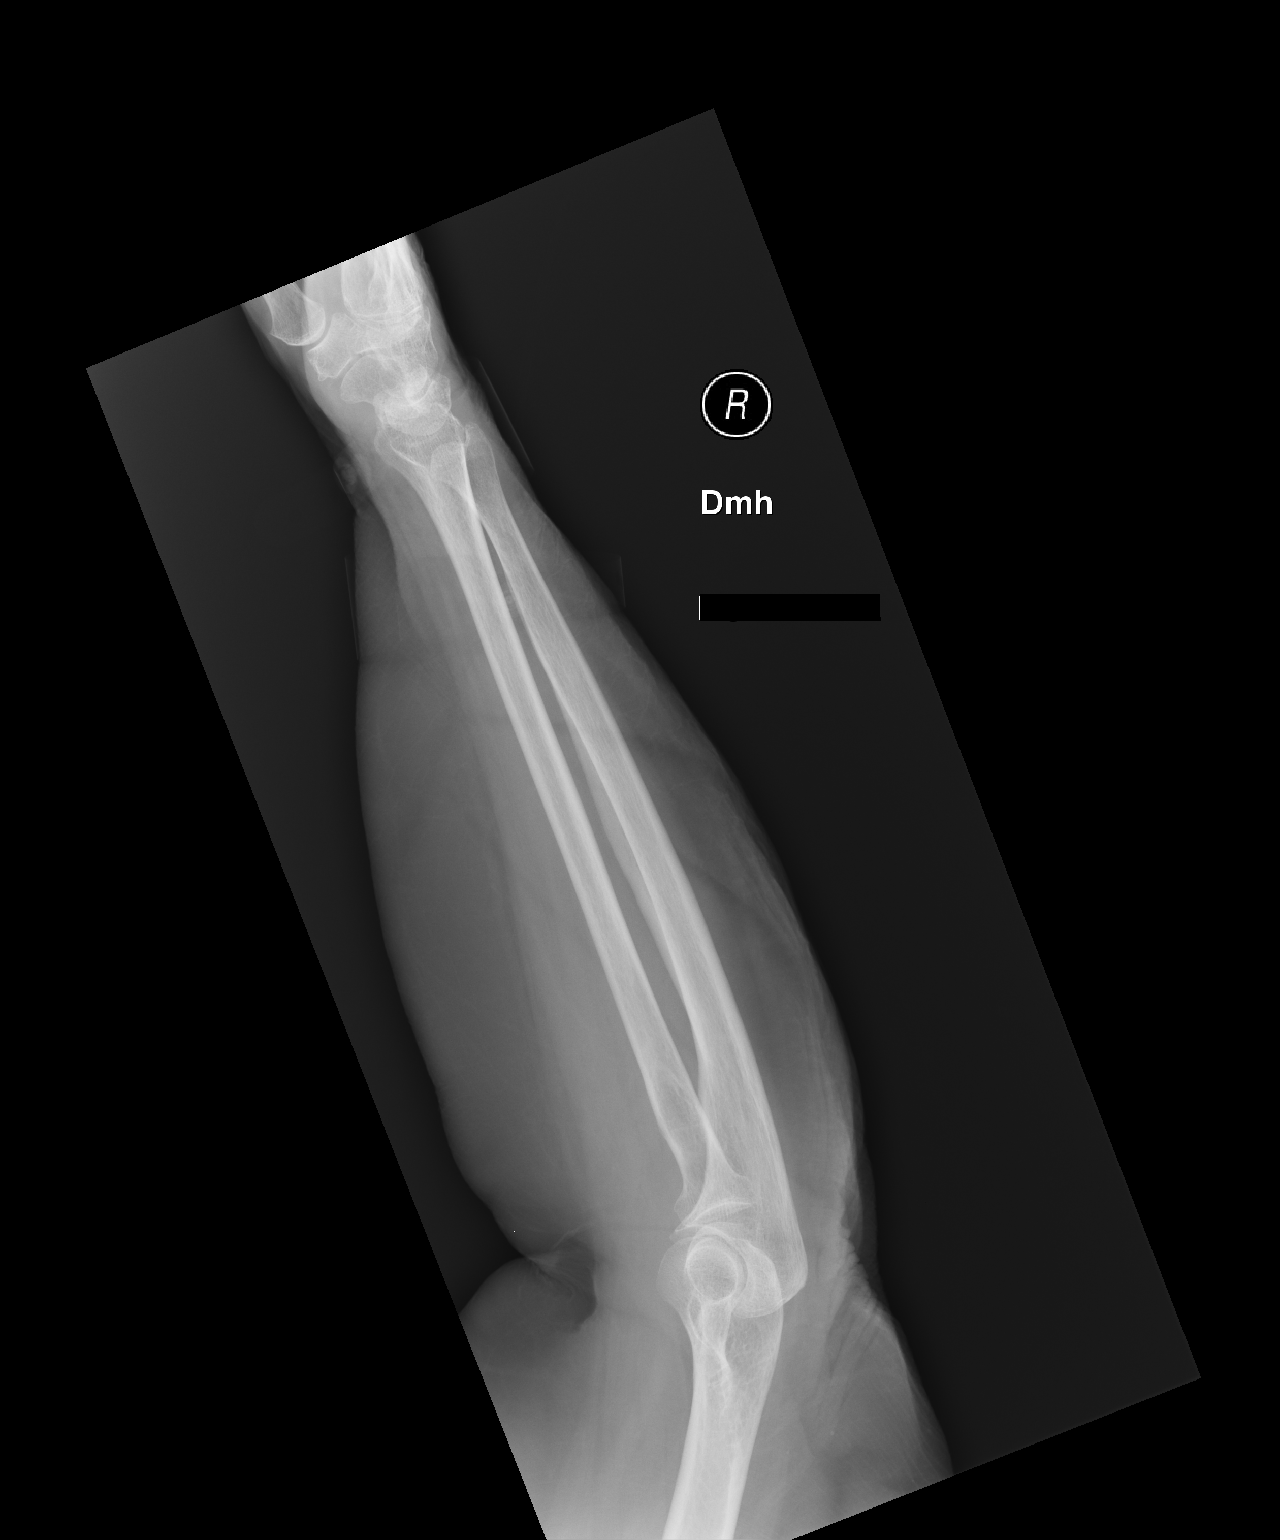

[2 of 2 positions shown; findings below may reference images not displayed]

FINDINGS: No evidence of acute fracture involving the radius or ulna. No
intrinsic osseous abnormality. Visualized wrist joint and elbow
joint intact. Bone mineral density well-preserved for age.
IMPRESSION: Normal examination.
